# Patient Record
Sex: Female | Born: 1935 | Race: White | Hispanic: No | Marital: Married | Smoking: Former smoker
Health system: Southern US, Community
[De-identification: ages and names within clinical notes are randomized; demographics above are authoritative.]

## PROBLEM LIST (undated history)

## (undated) DIAGNOSIS — E7800 Pure hypercholesterolemia, unspecified: Secondary | ICD-10-CM

## (undated) DIAGNOSIS — E46 Unspecified protein-calorie malnutrition: Secondary | ICD-10-CM

## (undated) DIAGNOSIS — E785 Hyperlipidemia, unspecified: Secondary | ICD-10-CM

## (undated) DIAGNOSIS — K5909 Other constipation: Secondary | ICD-10-CM

## (undated) DIAGNOSIS — F0390 Unspecified dementia without behavioral disturbance: Secondary | ICD-10-CM

## (undated) DIAGNOSIS — I10 Essential (primary) hypertension: Secondary | ICD-10-CM

## (undated) HISTORY — PX: EXCISION MORTON'S NEUROMA: SHX5013

## (undated) HISTORY — DX: Unspecified dementia, unspecified severity, without behavioral disturbance, psychotic disturbance, mood disturbance, and anxiety: F03.90

## (undated) HISTORY — DX: Hyperlipidemia, unspecified: E78.5

## (undated) HISTORY — DX: Other constipation: K59.09

## (undated) HISTORY — PX: CHOLECYSTECTOMY: SHX55

## (undated) HISTORY — DX: Essential (primary) hypertension: I10

## (undated) HISTORY — DX: Unspecified protein-calorie malnutrition: E46

---

## 2001-07-27 ENCOUNTER — Ambulatory Visit (HOSPITAL_COMMUNITY): Admission: RE | Admit: 2001-07-27 | Discharge: 2001-07-27 | Payer: Self-pay | Admitting: *Deleted

## 2004-09-02 ENCOUNTER — Ambulatory Visit (HOSPITAL_COMMUNITY): Admission: RE | Admit: 2004-09-02 | Discharge: 2004-09-02 | Payer: Self-pay | Admitting: *Deleted

## 2012-04-24 ENCOUNTER — Emergency Department (HOSPITAL_COMMUNITY): Payer: Medicare Other

## 2012-04-24 ENCOUNTER — Inpatient Hospital Stay (HOSPITAL_COMMUNITY)
Admission: EM | Admit: 2012-04-24 | Discharge: 2012-04-29 | DRG: 603 | Disposition: A | Discharge status: Skilled Nursing Facility | Payer: Medicare Other | Attending: Internal Medicine | Admitting: Internal Medicine

## 2012-04-24 ENCOUNTER — Encounter (HOSPITAL_COMMUNITY): Payer: Self-pay | Admitting: Neurology

## 2012-04-24 DIAGNOSIS — L02219 Cutaneous abscess of trunk, unspecified: Principal | ICD-10-CM | POA: Diagnosis present

## 2012-04-24 DIAGNOSIS — R627 Adult failure to thrive: Secondary | ICD-10-CM | POA: Diagnosis present

## 2012-04-24 DIAGNOSIS — R109 Unspecified abdominal pain: Secondary | ICD-10-CM | POA: Diagnosis present

## 2012-04-24 DIAGNOSIS — L03319 Cellulitis of trunk, unspecified: Secondary | ICD-10-CM

## 2012-04-24 DIAGNOSIS — Z87891 Personal history of nicotine dependence: Secondary | ICD-10-CM

## 2012-04-24 DIAGNOSIS — IMO0002 Reserved for concepts with insufficient information to code with codable children: Secondary | ICD-10-CM

## 2012-04-24 DIAGNOSIS — E785 Hyperlipidemia, unspecified: Secondary | ICD-10-CM

## 2012-04-24 DIAGNOSIS — F0390 Unspecified dementia without behavioral disturbance: Secondary | ICD-10-CM | POA: Diagnosis present

## 2012-04-24 DIAGNOSIS — R5381 Other malaise: Secondary | ICD-10-CM | POA: Diagnosis present

## 2012-04-24 DIAGNOSIS — R4182 Altered mental status, unspecified: Secondary | ICD-10-CM

## 2012-04-24 DIAGNOSIS — E860 Dehydration: Secondary | ICD-10-CM | POA: Diagnosis present

## 2012-04-24 DIAGNOSIS — Z79899 Other long term (current) drug therapy: Secondary | ICD-10-CM

## 2012-04-24 DIAGNOSIS — D72829 Elevated white blood cell count, unspecified: Secondary | ICD-10-CM

## 2012-04-24 HISTORY — DX: Pure hypercholesterolemia, unspecified: E78.00

## 2012-04-24 LAB — CBC
HCT: 40.6 % (ref 36.0–46.0)
Hemoglobin: 13.7 g/dL (ref 12.0–15.0)
MCH: 30.6 pg (ref 26.0–34.0)
MCHC: 33.7 g/dL (ref 30.0–36.0)
MCV: 90.8 fL (ref 78.0–100.0)
Platelets: 279 10*3/uL (ref 150–400)
RBC: 4.47 MIL/uL (ref 3.87–5.11)
RDW: 13.6 % (ref 11.5–15.5)
WBC: 13 10*3/uL — ABNORMAL HIGH (ref 4.0–10.5)

## 2012-04-24 LAB — BASIC METABOLIC PANEL
BUN: 20 mg/dL (ref 6–23)
CO2: 29 mEq/L (ref 19–32)
Calcium: 8.8 mg/dL (ref 8.4–10.5)
Chloride: 96 mEq/L (ref 96–112)
Creatinine, Ser: 0.59 mg/dL (ref 0.50–1.10)
GFR calc Af Amer: >90 mL/min (ref >90)
GFR calc non Af Amer: 78 mL/min — ABNORMAL LOW (ref >90)
Glucose, Bld: 172 mg/dL — ABNORMAL HIGH (ref 70–99)
Potassium: 3.5 mEq/L (ref 3.5–5.1)
Sodium: 134 mEq/L — ABNORMAL LOW (ref 135–145)

## 2012-04-24 LAB — URINALYSIS, ROUTINE W REFLEX MICROSCOPIC
Bilirubin Urine: NEGATIVE
Glucose, UA: NEGATIVE mg/dL
Hgb urine dipstick: NEGATIVE
Ketones, ur: NEGATIVE mg/dL
Leukocytes, UA: NEGATIVE
Nitrite: NEGATIVE
Protein, ur: NEGATIVE mg/dL
Specific Gravity, Urine: 1.009 (ref 1.005–1.030)
Urobilinogen, UA: 0.2 mg/dL (ref 0.0–1.0)
pH: 6.5 (ref 5.0–8.0)

## 2012-04-24 LAB — CK: Total CK: 90 U/L (ref 7–177)

## 2012-04-24 MED ORDER — ADULT MULTIVITAMIN W/MINERALS CH
1.0000 | ORAL_TABLET | Freq: Every day | ORAL | Status: DC
  Administered 2012-04-24 – 2012-04-28 (×3): 1 via ORAL
  Filled 2012-04-24 (×4): qty 1

## 2012-04-24 MED ORDER — SIMVASTATIN 40 MG PO TABS
40.0000 mg | ORAL_TABLET | Freq: Every day | ORAL | Status: DC
  Administered 2012-04-24 – 2012-04-28 (×5): 40 mg via ORAL
  Filled 2012-04-24 (×8): qty 1

## 2012-04-24 MED ORDER — CALCIUM-VITAMIN D-VITAMIN K 500-500-40 MG-UNT-MCG PO CHEW: 1.0000 | CHEWABLE_TABLET | Freq: Every day | ORAL | Status: DC

## 2012-04-24 MED ORDER — CALCIUM CARBONATE-VITAMIN D 500-200 MG-UNIT PO TABS
1.0000 | ORAL_TABLET | Freq: Every day | ORAL | Status: DC
  Administered 2012-04-24 – 2012-04-29 (×5): 1 via ORAL
  Filled 2012-04-24 (×8): qty 1

## 2012-04-24 NOTE — ED Provider Notes (Signed)
Assumed care of patient from Dr Juleen China.  Patient presented today after a fall.  She apparently lives at home by herself and was on the floor for 3 days after the fall.  Her work up here today has been unremarkable.  Dr. Juleen China has consulted Case Management and has consulted the Hospitalist for admission.  He spoke with Dr. Ardyth Harps with [facility].  She is requesting that SW or Case Management be consulted prior to admitting the patient.    6:48 PM SW has been consulted.  Have left a voicemail.  Will try to contact again.  Discussed plan with the patient and her family.  Reassessed patient.  Patient alert and orientated to self, No acute distress.  She denies any pain at this time.  Heart RRR, Lungs CTAB, Abdomen soft and nontender.  7:00 PM Discussed with on call SW.  She reports that she will not be able to get the patient placed tonight.  Discussed with Dr. Denton Lank.  He recommends calling Dr. Ardyth Harps back to have her admit the patient under obs.  Patient is very unsteady on her feet and is unsafe to discharge home at this time.  7:15 PM Paged Dr. Ardyth Harps directly.  7:45 PM Spoke with Dr. Toniann Fail with [facility].  He recommends keeping the patient in the CDU overnight because she does not meet criteria for admission and having SW see patient in the morning for placement.  Discussed with [facility] Case Management and she states that the patient does meet criteria to stay in the CDU overnight and that Case Management and SW will work on Placement for the patient tomorrow.    11:42 PM Reassessed patient.  She is resting comfortably at this time.    11:54 PM Patient signed out to Dr. Weldon Inches at shift change.  Pascal Lux [place], PA-C 04/24/12 2355

## 2012-04-24 NOTE — ED Notes (Signed)
Left message for Jody our Child psychotherapist.

## 2012-04-24 NOTE — ED Notes (Signed)
Dinner tray ordered.

## 2012-04-24 NOTE — ED Notes (Signed)
Family at bedside. 

## 2012-04-24 NOTE — ED Notes (Signed)
Melonie, ON-Call Social Worker called  Back  And spoke with Doran Durand, PA

## 2012-04-24 NOTE — ED Provider Notes (Signed)
History    89yf brought in by family for essentially what seems like failure to thrive. Pt declining in terms of taking care of ADLs in past month. Currently lives independently with children checking in on her. Pt has increasingly seemed more tired and withdrawn. Just wants to lay in bed. Eating less. On Saturday son checked on pt because hadn't heard from since Wednesday. Found on floor at home covered in excrement. Seemed confused. Cleaned pt up and seemed more alert and back to her self so delayed coming in. Spoke with PCP and recommended bringing into ED. No recent med changes that they are aware of. Unsure of circumstances of how she fell or how long may have been down.   CSN: [id number]  Arrival date & time 04/24/12  1240   First MD Initiated Contact with Patient 04/24/12 1308      Chief Complaint  Patient presents with  . Altered Mental Status    (Consider location/radiation/quality/duration/timing/severity/associated sxs/prior treatment) HPI  Past Medical History  Diagnosis Date  . Hypercholesteremia     Past Surgical History  Procedure Date  . Cholecystectomy     No family history on file.  History  Substance Use Topics  . Smoking status: Former Games developer  . Smokeless tobacco: Not on file  . Alcohol Use: No    OB History    Grav Para Term Preterm Abortions TAB SAB Ect Mult Living                  Review of Systems   Review of symptoms negative unless otherwise noted in HPI.   Allergies  Demerol; Lodine; and Tylenol  Home Medications   Current Outpatient Rx  Name  Route  Sig  Dispense  Refill  . CALCIUM-VITAMIN D-VITAMIN K 500-500-40 MG-UNT-MCG PO CHEW   Oral   Chew 1 tablet by mouth daily.         . ADULT MULTIVITAMIN W/MINERALS CH   Oral   Take 1 tablet by mouth daily.         Marland Kitchen PRAVASTATIN SODIUM 40 MG PO TABS   Oral   Take 40 mg by mouth daily.           BP 118/63  Pulse 93  Temp 97.8 F (36.6 C) (Oral)  Resp 16  SpO2  96%  Physical Exam  Nursing note and vitals reviewed. Constitutional: She appears well-developed. No distress.       Frail and cachectic but not distressed.  HENT:  Head: Normocephalic and atraumatic.       No external signs of head trauma  Eyes: Conjunctivae normal are normal. Pupils are equal, round, and reactive to light. Right eye exhibits no discharge. Left eye exhibits no discharge.  Neck: Neck supple.  Cardiovascular: Normal rate, regular rhythm and normal heart sounds.  Exam reveals no gallop and no friction rub.   No murmur heard. Pulmonary/Chest: Effort normal and breath sounds normal. No respiratory distress.  Abdominal: Soft. She exhibits no distension. There is no tenderness.       Mild distension, but her daughter says how always looks. Non tender.  Musculoskeletal: She exhibits no edema and no tenderness.  Neurological: She is alert. No cranial nerve deficit. She exhibits normal muscle tone.       Speech clear. Follows basic commands. Oriented only to self.  Skin: Skin is warm and dry. She is not diaphoretic.  Psychiatric: Her behavior is normal.    ED Course  Procedures (including critical care  time)  Labs Reviewed  CBC - Abnormal; Notable for the following:    WBC 13.0 (*)     All other components within normal limits  BASIC METABOLIC PANEL - Abnormal; Notable for the following:    Sodium 134 (*)     Glucose, Bld 172 (*)     GFR calc non Af Amer 78 (*)     All other components within normal limits  URINALYSIS, ROUTINE W REFLEX MICROSCOPIC  CK   Ct Head Wo Contrast  04/24/2012  *RADIOLOGY REPORT*  Clinical Data: Altered mental status.  CT HEAD WITHOUT CONTRAST  Technique:  Contiguous axial images were obtained from the base of the skull through the vertex without contrast.  Comparison: None.  Findings: 1.9 x 1.5 cm rounded, densely calcified mass along the tentorium on the left, measured on image #9.  No associated mass effect or edema.  Diffusely enlarged  ventricles and subarachnoid spaces.  Patchy white matter low density in both cerebral hemispheres.  Small amount of bilateral basal ganglia calcification.  No intracranial hemorrhage or CT evidence of acute infarction.  Bilateral hyperostosis frontalis.  The included portions of the paranasal sinuses are normally pneumatized.  IMPRESSION:  1.  No acute abnormality. 2.  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 3.  Atrophy and chronic small vessel white matter ischemic changes.   Original Report Authenticated By: Beckie Salts, M.D.       1. Altered mental status       MDM  89yF with generalized fatigue and inability to take care of ADLs. Pt lives alone. Will discuss with case management for placement options.         Raeford Razor, MD 04/24/12 [phone]

## 2012-04-24 NOTE — ED Notes (Signed)
Family states pt has had mentation decline for past two months. Pt lives alone, daughter stays with pt for 7-10 days a month, son checks on her other times. Neighbors check on pt, pt refuses to had any help come in to house, including meals on wheels. Does not cook for self, has not been eating well.

## 2012-04-24 NOTE — ED Notes (Signed)
Dinner Tray at the bedside  

## 2012-04-24 NOTE — Progress Notes (Signed)
Called by EDP for potential admission for this patient. She was found down at home covered in urine and feces. Family does not believe she can continue to live on her own and wants to pursue placement. She does not meet medical criteria for an inpatient admission and as such, it would be impossible to place her directly from the hospital unless family were to pay out of pocket. Have asked EDP to consult the ED SW and CM for further recommendations.  Peggye Pitt, MD [facility] Pager: [phone]

## 2012-04-24 NOTE — ED Notes (Signed)
Patient is resting comfortably. 

## 2012-04-24 NOTE — ED Notes (Signed)
Helped patient up to the bedside commode.

## 2012-04-24 NOTE — ED Notes (Signed)
Family assisting pt. With dinner

## 2012-04-24 NOTE — ED Notes (Signed)
Pt comes from home where she lives alone. Family had to break into home on Saturday found her on the floor prior to this last talked to her on Wednesday. EMS called out today because patient is confused and not acting right per her normal.  Pt is alert, disoriented x 4. When asked if she hurts "I have no idea". Pt following commands. BP 131/57, HR 90, RR 16, 97% RA, CBG 188.

## 2012-04-24 NOTE — ED Notes (Signed)
Pt provided graham crackers and milk. Pt passed swallow screen.

## 2012-04-25 ENCOUNTER — Emergency Department (HOSPITAL_COMMUNITY): Payer: Medicare Other

## 2012-04-25 ENCOUNTER — Encounter (HOSPITAL_COMMUNITY): Payer: Self-pay

## 2012-04-25 MED ORDER — SODIUM CHLORIDE 0.9 % IV SOLN
Freq: Once | INTRAVENOUS | Status: AC
  Administered 2012-04-25: 15:00:00 via INTRAVENOUS

## 2012-04-25 NOTE — ED Notes (Signed)
Upon entering room for hourly round assessment. Left wrist IV access removed by patient. Catheter intact on inspection. Bleeding controled. No skin discoloration noted. 0.9% NS paused. Onalee Hua, PA made aware.

## 2012-04-25 NOTE — Clinical Social Work Psychosocial (Signed)
Clinical Social Work Department BRIEF PSYCHOSOCIAL ASSESSMENT 04/25/2012  Patient:  Denise Lynn, Denise Lynn     Account Number:  [id number]     Admit date:  04/24/2012  Clinical Social Worker:  Delmer Islam  Date/Time:  04/25/2012 02:36 AM  Referred by:  Care Management  Date Referred:  04/25/2012 Referred for  SNF Placement   Other Referral:   Interview type:  Family Other interview type:    PSYCHOSOCIAL DATA Living Status:  ALONE Admitted from facility:   Level of care:   Primary support name:  Denise Lynn Primary support relationship to patient:  CHILD, ADULT Degree of support available:   Mr. Wafer [phone]) lives in [place] and checks on patient regulary and purchases her groceries. Patient's daughter Denise Lynn lives in [place], [place] [phone]) but visits patient regularly.    CURRENT CONCERNS Current Concerns  Post-Acute Placement   Other Concerns:    SOCIAL WORK ASSESSMENT / PLAN CSW talked at the bedside with patient's son and daughter. Patient was found after 3 days on the floor of her bedroom. Patient lives alone and has been fairly functional in meeting her basic needs until recently. Per son and daughter, up until 2 months ago when became wobbly when ambulating, patient was walking without the assistant of a walker. The patient stopped preparing her meals about 6 months ago and now eats simple meals as cereal, bananas, etc. The son and daughter also report that the patient now just wants to sleep all thtoe time. Son and daughter very concerned about patient's decline and are requesting SNF placement with the possibliltihy that she may have to remain at the SNF, or possibly transition to an ALF based on her improvement. Per family, patinet will not be able to return home.    CSW talked to family about the SNF search process and was given their facility choices in order of preference: [facility], [facility], [facility], [facility]. CSW and family  also talked about the future possibility of applying for Medicaid or consulting an Radio producer.   Assessment/plan status:  Psychosocial Support/Ongoing Assessment of Needs Other assessment/ plan:   Information/referral to community resources:   SNF list for [facility]    PATIENT'S/FAMILY'S RESPONSE TO PLAN OF CARE: Daughter and son very concerned about patient and the decline they have noticed. They have been aware that other housing options might soon be needed and the daughter had already begun  investigating nursing homes. Family very appreciative of CSW assistance in placing patient and providing information regarding Medicaid.

## 2012-04-25 NOTE — ED Notes (Signed)
Family at bedside. 

## 2012-04-25 NOTE — ED Provider Notes (Signed)
Patient holding in CDU awaiting nursing home placement.  Patient resting comfortably on exam, does not voice any complaints.  She is awake, conversant, but pleasantly confused.  Hemodynamically stable, lungs clear to auscultation bilaterally.  S1/S2, RRR,  Abdomen soft, bowel sounds present.  Jimmye Norman, NP 04/25/12 [phone]

## 2012-04-25 NOTE — ED Notes (Signed)
Patient was attempting to pull IV out and I re oriented the patient.  She also had taken her gown off and I put the gown back on her and advised her she was in CDU waiting to be approved for placement in another facility.

## 2012-04-25 NOTE — ED Provider Notes (Signed)
Patient placed in the CDU overnight Dr. Juleen China.  Patient was home and fell last week lying on the floor for 3 days. Family states that she has just not been getting better they brought her in for help with placement. Dr. Juleen China consulted case management and social work for assistance with placement.    9:06 AM Patient resting comfortably, alert and oriented to self.   On exam: hemodynamically stable, NAD, heart w/ RRR, lungs CTAB, Chest & abd non-tender, no peripheral edema or calf tenderness.     1:45 PM Dr Fonnie Jarvis assessed the patient and suggested a CXR which was ordered and found to be negative for active disease.  Pt did not eat any of her lunch and has spent most of the day asleep.  When aroused she is alert to person and place.  IVF maintenance fluids started as pt is not taking anything by mouth at this time.  Social work and Case Management have both been by today.  Pt is a private pay for nursing home placement. Family is okay with this and we will proceed with finding her place to go.  4:06 PM Report given to Felicie Morn, PA-C and he will assume care.  Dahlia Client Latona Krichbaum, PA-C 04/25/12 1607

## 2012-04-25 NOTE — Clinical Social Work Placement (Addendum)
Clinical Social Work Department CLINICAL SOCIAL WORK PLACEMENT NOTE 04/25/2012  Patient:  ITZIA, CUNLIFFE  Account Number:  [id number] Admit date:  04/24/2012  Clinical Social Worker:  Genelle Bal, LCSW  Date/time:  04/25/2012 06:19 AM  Clinical Social Work is seeking post-discharge placement for this patient at the following level of care:   SKILLED NURSING   (*CSW will update this form in Epic as items are completed)   04/25/2012  Patient/family provided with [facility] Department of Clinical Social Work's list of facilities offering this level of care within the geographic area requested by the patient (or if unable, by the patient's family).  04/25/2012  Patient/family informed of their freedom to choose among providers that offer the needed level of care, that participate in Medicare, Medicaid or managed care program needed by the patient, have an available bed and are willing to accept the patient.    Patient/family informed of [facility]' ownership interest in [facility], as well as of the fact that they are under no obligation to receive care at this facility.  PASARR submitted to EDS on 04/25/2012 PASARR number received from EDS on 04/25/2012  FL2 transmitted to all facilities in geographic area requested by pt/family on  04/25/2012 FL2 transmitted to all facilities within larger geographic area on   Patient informed that his/her managed care company has contracts with or will negotiate with  certain facilities, including the following:     Patient/family informed of bed offers received:   Patient chooses bed at  Physician recommends and patient chooses bed at    Patient to be transferred to [facility] 04/29/2012 Patient to be transferred to facility by [facility]  The following physician request were entered in Epic:   Additional Comments:

## 2012-04-25 NOTE — Progress Notes (Signed)
   CARE MANAGEMENT ED NOTE 04/25/2012  Patient:  KENTLEY, CEDILLO   Account Number:  [id number]  Date Initiated:  04/25/2012  Documentation initiated by:  Fransico Michael  Subjective/Objective Assessment:   presented to ED with c/o altered mental status     Subjective/Objective Assessment Detail:     Action/Plan:   Action/Plan Detail:   Anticipated DC Date:  04/25/2012     Status Recommendation to Physician:   Result of Recommendation:      DC Planning Services  CM consult    Choice offered to / List presented to:            Status of service:  Completed, signed off  ED Comments:   ED Comments Detail:  04/25/12-1103-J.Najeeb Uptain,RN,BSN [phone]     Received call last night from CDU PA regarding patient and need for placement. PA informed CM that patient did not meet requirements to be admitted. CM informed PA that CSW would have to be involved as well and that we would see patient this morning.      In to see patient, along with Erie Noe, CSW. Patient noted to be sleeping in bed. Adult children at bedside. Spoke with children who reported that patient "lives alone and up until about 2 months ago had been doing great and was very independent." When questioned about ideas for care of patient, both children requested skilled nursing home placement and voiced willingness to pay out of pocket. No further CM needs identified at this time. CSW to continue to work with family.

## 2012-04-25 NOTE — ED Provider Notes (Signed)
Pt seen in obs, holding unit by [facility], awaiting social work complete evaluation and placement.    Denise Pound. Faustino Luecke, MD 04/25/12 1649

## 2012-04-25 NOTE — ED Notes (Signed)
Patient is resting comfortably. 

## 2012-04-26 ENCOUNTER — Inpatient Hospital Stay (HOSPITAL_COMMUNITY): Payer: Medicare Other

## 2012-04-26 ENCOUNTER — Encounter (HOSPITAL_COMMUNITY): Payer: Self-pay

## 2012-04-26 DIAGNOSIS — E785 Hyperlipidemia, unspecified: Secondary | ICD-10-CM

## 2012-04-26 DIAGNOSIS — D72829 Elevated white blood cell count, unspecified: Secondary | ICD-10-CM

## 2012-04-26 DIAGNOSIS — IMO0002 Reserved for concepts with insufficient information to code with codable children: Secondary | ICD-10-CM

## 2012-04-26 DIAGNOSIS — L02219 Cutaneous abscess of trunk, unspecified: Principal | ICD-10-CM

## 2012-04-26 DIAGNOSIS — R4182 Altered mental status, unspecified: Secondary | ICD-10-CM

## 2012-04-26 LAB — URINALYSIS, ROUTINE W REFLEX MICROSCOPIC
Glucose, UA: NEGATIVE mg/dL
pH: 5.5 (ref 5.0–8.0)

## 2012-04-26 LAB — BASIC METABOLIC PANEL
CO2: 34 mEq/L — ABNORMAL HIGH (ref 19–32)
Chloride: 96 mEq/L (ref 96–112)
Creatinine, Ser: 0.74 mg/dL (ref 0.50–1.10)
Glucose, Bld: 204 mg/dL — ABNORMAL HIGH (ref 70–99)
Sodium: 137 mEq/L (ref 135–145)

## 2012-04-26 LAB — URINE MICROSCOPIC-ADD ON

## 2012-04-26 LAB — CBC WITH DIFFERENTIAL/PLATELET
Basophils Absolute: 0 10*3/uL (ref 0.0–0.1)
Eosinophils Relative: 4 % (ref 0–5)
HCT: 38.1 % (ref 36.0–46.0)
Lymphocytes Relative: 6 % — ABNORMAL LOW (ref 12–46)
Lymphs Abs: 1 10*3/uL (ref 0.7–4.0)
MCV: 94.1 fL (ref 78.0–100.0)
Monocytes Absolute: 0.8 10*3/uL (ref 0.1–1.0)
Neutro Abs: 13 10*3/uL — ABNORMAL HIGH (ref 1.7–7.7)
RBC: 4.05 MIL/uL (ref 3.87–5.11)
RDW: 13.7 % (ref 11.5–15.5)
WBC: 15.2 10*3/uL — ABNORMAL HIGH (ref 4.0–10.5)

## 2012-04-26 LAB — TROPONIN I: Troponin I: <0.3 ng/mL (ref <0.30)

## 2012-04-26 MED ORDER — SODIUM CHLORIDE 0.9 % IJ SOLN
3.0000 mL | Freq: Two times a day (BID) | INTRAMUSCULAR | Status: DC
  Administered 2012-04-27 – 2012-04-28 (×2): 3 mL via INTRAVENOUS

## 2012-04-26 MED ORDER — ACETAMINOPHEN 325 MG PO TABS: 650.0000 mg | ORAL_TABLET | Freq: Four times a day (QID) | ORAL | Status: DC | PRN

## 2012-04-26 MED ORDER — ENSURE COMPLETE PO LIQD
237.0000 mL | Freq: Two times a day (BID) | ORAL | Status: DC
  Administered 2012-04-26 – 2012-04-29 (×5): 237 mL via ORAL

## 2012-04-26 MED ORDER — CEFAZOLIN SODIUM 1-5 GM-% IV SOLN
1.0000 g | Freq: Three times a day (TID) | INTRAVENOUS | Status: DC
  Administered 2012-04-26 – 2012-04-28 (×6): 1 g via INTRAVENOUS
  Filled 2012-04-26 (×9): qty 50

## 2012-04-26 MED ORDER — ONDANSETRON HCL 4 MG PO TABS: 4.0000 mg | ORAL_TABLET | Freq: Four times a day (QID) | ORAL | Status: DC | PRN

## 2012-04-26 MED ORDER — ACETAMINOPHEN 650 MG RE SUPP: 650.0000 mg | Freq: Four times a day (QID) | RECTAL | Status: DC | PRN

## 2012-04-26 MED ORDER — SODIUM CHLORIDE 0.9 % IV SOLN
INTRAVENOUS | Status: DC
  Administered 2012-04-26: 17:00:00 via INTRAVENOUS

## 2012-04-26 MED ORDER — ONDANSETRON HCL 4 MG/2ML IJ SOLN: 4.0000 mg | Freq: Four times a day (QID) | INTRAMUSCULAR | Status: DC | PRN

## 2012-04-26 MED ORDER — ENOXAPARIN SODIUM 40 MG/0.4ML ~~LOC~~ SOLN
40.0000 mg | SUBCUTANEOUS | Status: DC
  Administered 2012-04-26 – 2012-04-28 (×3): 40 mg via SUBCUTANEOUS
  Filled 2012-04-26 (×4): qty 0.4

## 2012-04-26 MED ORDER — VALACYCLOVIR HCL 500 MG PO TABS
1000.0000 mg | ORAL_TABLET | Freq: Two times a day (BID) | ORAL | Status: DC
  Administered 2012-04-26 – 2012-04-29 (×6): 1000 mg via ORAL
  Filled 2012-04-26 (×10): qty 2

## 2012-04-26 NOTE — ED Provider Notes (Signed)
Pt placed in CDU on holding for nursing home placement.  Pt alert, pleasantly confused, NAD, denies pain.   On exam: hemodynamically stable, NAD, heart w/ RRR, lungs CTAB, Chest & abd non-tender, no peripheral edema or calf tenderness.  BP 96/56  Pulse 91  Temp 98.9 F (37.2 C) (Oral)  Resp 10  SpO2 93%    10:58 AM  Patient evaluated by [facility] with the decision to admit.    12:40 PM  Reassessed and resting comfortably.  Up to chair.  Alert and interactive, NAD, denies pain or other complaints.  BP 120/52  Pulse 89  Temp 97.9 F (36.6 C) (Oral)  Resp 16  SpO2 94%   Pt admitted by [facility].  Dahlia Client Newman Waren, PA-C 04/26/12 1644

## 2012-04-26 NOTE — ED Provider Notes (Signed)
Medical screening examination/treatment/procedure(s) were performed by non-physician practitioner and as supervising physician I was immediately available for consultation/collaboration.  Geoffery Lyons, MD 04/26/12 [phone]

## 2012-04-26 NOTE — ED Notes (Signed)
Use steady to get pt.up in recliner so pt. Can eat lunch

## 2012-04-26 NOTE — Progress Notes (Signed)
Received pt from ED.  Pt alert to person.  Family at the bedside. Placed patient on high fall risk.  Pt placed on telemetry. Stage II to left buttocks. Reddened rash to right back.  Skin tear to right lateral neck with gauze drsg.  Pt is not in distress. Cleaned up patient and replaced with new gown and sheets. Will continue to monitor.

## 2012-04-26 NOTE — Clinical Social Work Note (Signed)
CSW contacted by patient's daughter and advised that patient will be admitted to hospital per Dr. Arbutus Leas. CSW advised daughter that she will be advised of SNF responses.  Patient admitted and is on 5500.  Genelle Bal, MSW, LCSW [phone]

## 2012-04-26 NOTE — ED Provider Notes (Signed)
Pt of Dr Juleen China, in cdu awaiting cm/sw and hospitalist eval.   Suzi Roots, MD 04/26/12 [phone]

## 2012-04-26 NOTE — Progress Notes (Signed)
KATHYRN WARMUTH [id number] Admission Data: 04/26/2012 5:07 PM Attending Provider: Catarina Hartshorn, MD  PCP:No primary provider on file. Consults/ Treatment Team:    ILYNN STAUFFER is a 78 y.o. female patient admitted from ED awake, alert  & orientated  X 3,  Full Code, VSS - Blood pressure 117/43, pulse 91, temperature 98.9 F (37.2 C), temperature source Oral, resp. rate 18, SpO2 96.00%., no c/o shortness of breath, no c/o chest pain, no distress noted. Tele # [id number] placed and pt is currently running:normal sinus rhythm.   IV site WDL:  hand right, condition patent and no redness with a transparent dsg that's clean dry and intact.  Allergies:   Allergies  Allergen Reactions  . Demerol (Meperidine) Other (See Comments)    agitation  . Lodine (Etodolac) Other (See Comments)    Mouth blisters  . Tylenol (Acetaminophen) Other (See Comments)    dizziness     Past Medical History  Diagnosis Date  . Hypercholesteremia   . Dementia     History:  obtained from child. Tobacco/alcohol: denied none  Pt orientation to unit, room and routine. Information packet given to patient/family and safety video watched.  Admission INP armband ID verified with patient/family, and in place. SR up x 2, fall risk assessment complete with Patient and family verbalizing understanding of risks associated with falls. Pt verbalizes an understanding of how to use the call bell and to call for help before getting out of bed.  Skin has scars on midsection that are sensitive. Scabs on back no draining. Stage II on L Buttock. Scab on L foot.    Will cont to monitor and assist as needed.  Tarahji Ramthun Consuella Lose, RN 04/26/2012 5:07 PM

## 2012-04-26 NOTE — Progress Notes (Signed)
Spoke with Melissa with infection control and agreed that patient did not have active/draining or fluid filled pustules. All scabs on back. Discontinued contact precautions.

## 2012-04-26 NOTE — H&P (Signed)
[facility] History and Physical  Denise Lynn NUU:[id number] DOB: [birth year] DOA: 04/24/2012   PCP: No primary provider on file.   Chief Complaint: Altered mental status  HPI:   78 year old female with a history of hyperlipidemia presented with altered mental status. The patient is presently confused; therefore, this history is from the patient's daughter who is at the bedside. The patient lives by herself. The patient's son, [name], checks up on her every 7-10 days. However, the patient has had a significant decline in the past 2 months in terms of her physical stability and appetite. The patient's son found the patient on Saturday, 04/22/2012, in her own feces. The patient was arousable, but was confused when she was aroused. The patient's daughter, [name], lives near [place] and had not been able to contact the patient by telephone for a period of 3 days. At that time, [name] contacted [name], the son, to go over to the patient's home where he found her in her own feces. The patient was subsequently brought to the emergency department for further evaluation. Apparently, the patient has not been able to fix her own food for at least 2 months. The patient's son and daughter would cook for the patient intermittently and  stored food in the refrigerator with instructions for the patient to eat or keep up the food. However, the patient has not followed these instructions. Instead, the patient has been eating mostly nonperishable type items for the past 2-3 months. In addition, there was also some history that the patient has had increasing difficulty getting around. The patient went to see her primary care provider, Dr. [name], 2 weeks ago.according to the daughter, the patient was much more alert at that time. However, the daughter does note that the patient has had a gradual decline over the last few months. The son and the daughter have to help the patient with her checkbook.  Assessment/Plan:  altered mental status  -Likely multifactorial including the possibility of herpes zoster/cellulitis on her back as well as a component of dehydration, volume depletion  -Judicious IV fluids -At the time of my evaluation, no labs has been done for recheck on the patient in 48 hours--order BMP and CBC now -CT brain negative Cellulitis/herpes zoster dermatitis  -It was somewhat difficult to differentiate whether this was fully herpes zoster with secondary bacterial infection due to the patient being immobile in her own feces for 3 days -Nevertheless, there was erythema and small vesicles primarily right-sided on the patient's right thoracic back  -Although the patient is beyond the 72 hour window, treatment with Valtrex will decrease chances of postherpetic neuralgia and complications from HZV -Will also start the patient on cefazolin for possible secondary bacterial infection - patient has not been on any antibiotics nor hospitalized recently  Leukocytosis  -May be due to herpes zoster/cellulitis  -Blood cultures x2 sets  -Valtrex and cefazolin as discussed  -Urinalysis does not suggest UTI  Deconditioning  -PT evaluation  -Family requests SNF placement Failure to thrive  -Check prealbumin  -Ensure supplementation  Abdominal pain -May be constipation -Check two-view abdomen      Past Medical History  Diagnosis Date  . Hypercholesteremia    Past Surgical History  Procedure Date  . Cholecystectomy    Social History:  reports that she has quit smoking. She does not have any smokeless tobacco history on file. She reports that she does not drink alcohol or use illicit drugs.    family history : Unobtainable secondary to  patient's mental status   Allergies  Allergen Reactions  . Demerol (Meperidine) Other (See Comments)    agitation  . Lodine (Etodolac) Other (See Comments)    Mouth blisters  . Tylenol (Acetaminophen) Other (See Comments)    dizziness      Prior to  Admission medications   Medication Sig Start Date End Date Taking? Authorizing Provider  Calcium-Vitamin D-Vitamin K (VIACTIV) 500-500-40 MG-UNT-MCG CHEW Chew 1 tablet by mouth daily.   Yes Historical Provider, MD  Multiple Vitamin (MULTIVITAMIN WITH MINERALS) TABS Take 1 tablet by mouth daily.   Yes Historical Provider, MD  pravastatin (PRAVACHOL) 40 MG tablet Take 40 mg by mouth daily.   Yes Historical Provider, MD    Review of Systems:  Limited due to the patient's mental status . However, patient currently denies any headache, visual changes, chest pain, shortness breath, nausea, vomiting, diarrhea, abdominal pain, coughing, hemoptysis, abdominal pain.   Physical Exam: Filed Vitals:   04/25/12 1807 04/26/12 0005 04/26/12 0615 04/26/12 0953  BP:  114/61 96/56 120/52  Pulse:  90 91 89  Temp: 98.7 F (37.1 C) 99 F (37.2 C) 98.9 F (37.2 C) 97.9 F (36.6 C)  TempSrc: Oral Axillary Oral Oral  Resp:  20 10 16   SpO2:  94% 93% 94%   General:  A&O x 3, NAD, nontoxic, pleasant/cooperative Head/Eye: No conjunctival hemorrhage, no icterus, [place]/AT, No nystagmus ENT:  No icterus,  No thrush, good dentition, no pharyngeal exudate Neck:  No masses, no lymphadenpathy, no bruits CV:  RRR, no rub, no gallop, no S3 Lung:  CTAB, good air movement, no wheeze, no rhonchi Abdomen: soft/ +BS, nondistended, no peritoneal signs--mild tenderness to palpation right upper quadrant, right lower quadrant, no rebound tenderness Ext: No cyanosis,  No petechiae, No lymphangitis, No edema--right flank/posterior thorax with erythema and scattered vesicles--no open wounds, no necrosis   Labs on Admission:  Basic Metabolic Panel:  Lab 04/24/12 1610  NA 134*  K 3.5  CL 96  CO2 29  GLUCOSE 172*  BUN 20  CREATININE 0.59  CALCIUM 8.8  MG --  PHOS --   Liver Function Tests: No results found for this basename: AST:5,ALT:5,ALKPHOS:5,BILITOT:5,PROT:5,ALBUMIN:5 in the last 168 hours No results found for this  basename: LIPASE:5,AMYLASE:5 in the last 168 hours No results found for this basename: AMMONIA:5 in the last 168 hours CBC:  Lab 04/24/12 1313  WBC 13.0*  NEUTROABS --  HGB 13.7  HCT 40.6  MCV 90.8  PLT 279   Cardiac Enzymes:  Lab 04/24/12 1359  CKTOTAL 90  CKMB --  CKMBINDEX --  TROPONINI --   BNP: No components found with this basename: POCBNP:5 CBG: No results found for this basename: GLUCAP:5 in the last 168 hours  Radiological Exams on Admission: Ct Head Wo Contrast  04/24/2012  *RADIOLOGY REPORT*  Clinical Data: Altered mental status.  CT HEAD WITHOUT CONTRAST  Technique:  Contiguous axial images were obtained from the base of the skull through the vertex without contrast.  Comparison: None.  Findings: 1.9 x 1.5 cm rounded, densely calcified mass along the tentorium on the left, measured on image #9.  No associated mass effect or edema.  Diffusely enlarged ventricles and subarachnoid spaces.  Patchy white matter low density in both cerebral hemispheres.  Small amount of bilateral basal ganglia calcification.  No intracranial hemorrhage or CT evidence of acute infarction.  Bilateral hyperostosis frontalis.  The included portions of the paranasal sinuses are normally pneumatized.  IMPRESSION:  1.  No  acute abnormality. 2.  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 3.  Atrophy and chronic small vessel white matter ischemic changes.   Original Report Authenticated By: Beckie Salts, M.D.    Dg Chest Portable 1 View  04/25/2012  *RADIOLOGY REPORT*  Clinical Data: Altered mental status  PORTABLE CHEST - 1 VIEW  Comparison: None.  Findings: Cardiomediastinal silhouette is unremarkable.  Mild elevation of the right hemidiaphragm.  Mild right basilar atelectasis.  No acute infiltrate or pulmonary edema.  Mild degenerative changes thoracic spine.  IMPRESSION: No active disease.  Mild elevation of the right hemidiaphragm with right basilar atelectasis.   Original Report Authenticated By:  Natasha Mead, M.D.     EKG: Independently reviewed. Sinus rhythm, no ST-T wave changes    Time spent:70 minutes Code Status:   Full Family Communication:   daughter at bedside   Abem Shaddix, DO  [facility] Pager [phone]  If 7PM-7AM, please contact night-coverage [email or web address] Password [facility] 04/26/2012, 10:41 AM

## 2012-04-26 NOTE — Care Management Note (Unsigned)
    Page 1 of 1   04/26/2012     3:28:48 PM   CARE MANAGEMENT NOTE 04/26/2012  Patient:  Denise Lynn, Denise Lynn   Account Number:  [id number]  Date Initiated:  04/26/2012  Documentation initiated by:  Letha Cape  Subjective/Objective Assessment:   dx cellulitis of flank  admit- lives alone.     Action/Plan:   pt eval   Anticipated DC Date:  04/28/2012   Anticipated DC Plan:  SKILLED NURSING FACILITY  In-house referral  Clinical Social Worker      DC Planning Services  CM consult      Choice offered to / List presented to:             Status of service:  In process, will continue to follow Medicare Important Message given?   (If response is "NO", the following Medicare IM given date fields will be blank) Date Medicare IM given:   Date Additional Medicare IM given:    Discharge Disposition:    Per UR Regulation:  Reviewed for med. necessity/level of care/duration of stay  If discussed at Long Length of Stay Meetings, dates discussed:    Comments:  04/26/12 15:27 Letha Cape RN, BSN [phone] patient lives alone, son cks on her every 7-10 per note, awaiting pt eval.

## 2012-04-26 NOTE — ED Notes (Signed)
PAITIENT IS NONAMBULATORY. SHE IS IN HOLDING PATTERN AWAITING NURSING HOME PLACEMENT. SHE IS INCONTINENT OF BOWEL AND BLADDER. SHE IS A TOTAL ASSIST AT MEAL TIMES.

## 2012-04-26 NOTE — ED Notes (Signed)
Family at bedside. 

## 2012-04-27 LAB — COMPREHENSIVE METABOLIC PANEL
AST: 18 U/L (ref 0–37)
CO2: 30 mEq/L (ref 19–32)
Calcium: 8.2 mg/dL — ABNORMAL LOW (ref 8.4–10.5)
Creatinine, Ser: 0.57 mg/dL (ref 0.50–1.10)
GFR calc Af Amer: >90 mL/min (ref >90)
GFR calc non Af Amer: 79 mL/min — ABNORMAL LOW (ref >90)
Glucose, Bld: 187 mg/dL — ABNORMAL HIGH (ref 70–99)
Total Protein: 5.7 g/dL — ABNORMAL LOW (ref 6.0–8.3)

## 2012-04-27 LAB — PREALBUMIN: Prealbumin: 5.5 mg/dL — ABNORMAL LOW (ref 17.0–34.0)

## 2012-04-27 LAB — HEMOGLOBIN A1C: Mean Plasma Glucose: 137 mg/dL — ABNORMAL HIGH (ref <117)

## 2012-04-27 MED ORDER — POTASSIUM CHLORIDE CRYS ER 20 MEQ PO TBCR
40.0000 meq | EXTENDED_RELEASE_TABLET | Freq: Once | ORAL | Status: AC
  Administered 2012-04-27: 40 meq via ORAL
  Filled 2012-04-27: qty 2

## 2012-04-27 NOTE — Progress Notes (Signed)
[facility] PROGRESS NOTE  Denise Lynn ZOX:[id number] DOB: 1919-04-06 DOA: 04/24/2012 PCP: No primary provider on file.  HPI/Subjective: Denies any specific complaint   Assessment/Plan:  Altered mental status  -Patient likely has delirium on top of dementia. -Patient is confused and disoriented but she is fully awake. -She has negative CT scan of the head, MB secondary to dehydration or acute illness.  Cellulitis/herpes zoster dermatitis  -It was somewhat difficult to differentiate whether this was fully herpes zoster with secondary bacterial infection due to the patient being immobile in her own feces for 3 days  -Nevertheless, there was erythema and small vesicles primarily right-sided on the patient's right thoracic back  -Although the patient is beyond the 72 hour window, treatment with Valtrex will decrease chances of postherpetic neuralgia and complications from HZV  -Cefazolin started for any possible secondary bacterial infection.  Leukocytosis  -May be due to herpes zoster/cellulitis  -Blood cultures x2 sets  -Valtrex and cefazolin as discussed  -Urinalysis does not suggest UTI.  Deconditioning  -PT/OT evaluation  -Family requests SNF placement.   Failure to thrive  -Check prealbumin  -Ensure supplementation   Abdominal pain  -May be constipation  -Abdominal x-ray showed no free air or obstruction.   Code Status: Full Family Communication:  Disposition Plan: Remains inpatient   Consultants:  None  Procedures:  None  Antibiotics:  Cefazolin started on 04/26/2012   Objective: Filed Vitals:   04/26/12 1500 04/26/12 1700 04/26/12 2053 04/27/12 0445  BP: 117/43  112/89 124/43  Pulse: 91  100 92  Temp: 98.9 F (37.2 C)  97.9 F (36.6 C) 98.1 F (36.7 C)  TempSrc: Oral  Oral Oral  Resp: 18  16 16   Height:  5\' 5"  (1.651 m)    Weight:  57.607 kg (127 lb)    SpO2: 96%  94% 96%    Intake/Output Summary (Last 24 hours) at 04/27/12  1331 Last data filed at 04/27/12 0900  Gross per 24 hour  Intake 1461.75 ml  Output      0 ml  Net 1461.75 ml   Filed Weights   04/26/12 1700  Weight: 57.607 kg (127 lb)    Exam:  General: Alert and awake, oriented x3, not in any acute distress. HEENT: anicteric sclera, pupils reactive to light and accommodation, EOMI CVS: S1-S2 clear, no murmur rubs or gallops Chest: clear to auscultation bilaterally, no wheezing, rales or rhonchi Abdomen: soft nontender, nondistended, normal bowel sounds, no organomegaly Extremities: no cyanosis, clubbing or edema noted bilaterally Neuro: Cranial nerves II-XII intact, no focal neurological deficits  Data Reviewed: Basic Metabolic Panel:  Lab 04/27/12 8119 04/26/12 0946 04/24/12 1313  NA 140 137 134*  K 3.4* 3.7 3.5  CL 102 96 96  CO2 30 34* 29  GLUCOSE 187* 204* 172*  BUN 14 16 20   CREATININE 0.57 0.74 0.59  CALCIUM 8.2* 8.7 8.8  MG -- -- --  PHOS -- -- --   Liver Function Tests:  Lab 04/27/12 0700  AST 18  ALT 13  ALKPHOS 58  BILITOT 0.2*  PROT 5.7*  ALBUMIN 1.9*   No results found for this basename: LIPASE:5,AMYLASE:5 in the last 168 hours No results found for this basename: AMMONIA:5 in the last 168 hours CBC:  Lab 04/26/12 0946 04/24/12 1313  WBC 15.2* 13.0*  NEUTROABS 13.0* --  HGB 12.5 13.7  HCT 38.1 40.6  MCV 94.1 90.8  PLT 253 279   Cardiac Enzymes:  Lab 04/26/12 1701 04/26/12 1100  04/24/12 1359  CKTOTAL -- -- 90  CKMB -- -- --  CKMBINDEX -- -- --  TROPONINI <0.30 <0.30 --   BNP (last 3 results) No results found for this basename: PROBNP:3 in the last 8760 hours CBG: No results found for this basename: GLUCAP:5 in the last 168 hours  Recent Results (from the past 240 hour(s))  CULTURE, BLOOD (ROUTINE X 2)     Status: Normal (Preliminary result)   Collection Time   04/26/12 11:00 AM      Component Value Range Status Comment   Specimen Description BLOOD RIGHT ARM   Final    Special Requests BOTTLES  DRAWN AEROBIC AND ANAEROBIC 10CC   Final    Culture  Setup Time 04/26/2012 16:12   Final    Culture     Final    Value:        BLOOD CULTURE RECEIVED NO GROWTH TO DATE CULTURE WILL BE HELD FOR 5 DAYS BEFORE ISSUING A FINAL NEGATIVE REPORT   Report Status PENDING   Incomplete   CULTURE, BLOOD (ROUTINE X 2)     Status: Normal (Preliminary result)   Collection Time   04/26/12 11:07 AM      Component Value Range Status Comment   Specimen Description BLOOD LEFT ARM   Final    Special Requests BOTTLES DRAWN AEROBIC AND ANAEROBIC 10CC   Final    Culture  Setup Time 04/26/2012 16:12   Final    Culture     Final    Value:        BLOOD CULTURE RECEIVED NO GROWTH TO DATE CULTURE WILL BE HELD FOR 5 DAYS BEFORE ISSUING A FINAL NEGATIVE REPORT   Report Status PENDING   Incomplete      Studies: Dg Abd Portable 2v  04/26/2012  *RADIOLOGY REPORT*  Clinical Data: Abdominal pain  PORTABLE ABDOMEN - 2 VIEW  Comparison: None.  Findings: Scattered air and stool throughout the bowel.  No definite free air on the decubitus view.  Lung bases clear. Atherosclerotic calcifications noted.  Diffuse degenerative changes of the spine and osteopenia.  IMPRESSION: Negative for obstruction or free air.   Original Report Authenticated By: Judie Petit. Shick, M.D.     Scheduled Meds:   . calcium-vitamin D  1 tablet Oral Daily  .  ceFAZolin (ANCEF) IV  1 g Intravenous Q8H  . enoxaparin (LOVENOX) injection  40 mg Subcutaneous Q24H  . feeding supplement  237 mL Oral BID BM  . multivitamin with minerals  1 tablet Oral Daily  . simvastatin  40 mg Oral q1800  . sodium chloride  3 mL Intravenous Q12H  . valACYclovir  1,000 mg Oral BID   Continuous Infusions:   . sodium chloride 75 mL/hr at 04/26/12 1705    Active Problems:  Cellulitis of flank  Altered mental status  Hyperlipidemia  Leukocytosis  Failure to thrive    Time spent: 36 minutes    [facility]  [facility] Pager [phone]. If 8PM-8AM, please  contact night-coverage at [email or web address], password [facility] 04/27/2012, 1:31 PM  LOS: 3 days

## 2012-04-28 LAB — BASIC METABOLIC PANEL
Calcium: 8.1 mg/dL — ABNORMAL LOW (ref 8.4–10.5)
Chloride: 99 mEq/L (ref 96–112)
Creatinine, Ser: 0.5 mg/dL (ref 0.50–1.10)
GFR calc Af Amer: >90 mL/min (ref >90)

## 2012-04-28 LAB — CBC
Platelets: 220 10*3/uL (ref 150–400)
RDW: 13.7 % (ref 11.5–15.5)
WBC: 16.2 10*3/uL — ABNORMAL HIGH (ref 4.0–10.5)

## 2012-04-28 MED ORDER — FLEET ENEMA 7-19 GM/118ML RE ENEM
1.0000 | ENEMA | Freq: Once | RECTAL | Status: DC
  Filled 2012-04-28: qty 1

## 2012-04-28 MED ORDER — CEFUROXIME AXETIL 250 MG PO TABS
500.0000 mg | ORAL_TABLET | Freq: Two times a day (BID) | ORAL | Status: DC
  Filled 2012-04-28 (×2): qty 2

## 2012-04-28 MED ORDER — CEFUROXIME AXETIL 500 MG PO TABS: 500.0000 mg | ORAL_TABLET | Freq: Two times a day (BID) | ORAL | Status: DC

## 2012-04-28 NOTE — Progress Notes (Signed)
Physical Therapy Evaluation Patient Details Name: Denise Lynn MRN: [id number] DOB: 31-Oct-1918 Today's Date: 04/28/2012 Time: 4098-1191 PT Time Calculation (min): 26 min  PT Assessment / Plan / Recommendation Clinical Impression  78 yo admitted after being found alone at home lying in her own feces, failing to thrive; Presents with decr functional mobility; Will benefit frim PT to maximize safety with functional mobility, and to facilitate dc planning; Will need a higher level of care    PT Assessment  Patient needs continued PT services    Follow Up Recommendations  SNF;Supervision/Assistance - 24 hour    Does the patient have the potential to tolerate intense rehabilitation      Barriers to Discharge Decreased caregiver support Cannot take care of herself    Equipment Recommendations  Rolling walker with 5" wheels;3 in 1 bedside comode    Recommendations for Other Services     Frequency Min 3X/week    Precautions / Restrictions Precautions Precautions: Fall   Pertinent Vitals/Pain no apparent distress       Mobility  Bed Mobility Bed Mobility: Rolling Right;Right Sidelying to Sit;Sitting - Scoot to [place] of Bed Rolling Right: 2: Max assist;With rail Right Sidelying to Sit: 2: Max assist;With rails Sitting - Scoot to [place] of Bed: 2: Max assist;With rail Details for Bed Mobility Assistance: Requiring max verbal and tactile cueing for initiation of task; it seems pt is not able to fully understand the tasks that are asked of her; Noted small red area at sacrum, notified RN and repositioned pt OOB for pressure relief Transfers Transfers: Sit to Stand;Stand to Sit;Stand Pivot Transfers Sit to Stand: 1: +2 Total assist Sit to Stand: Patient Percentage: 30% Stand to Sit: 1: +2 Total assist Stand to Sit: Patient Percentage: 30% Stand Pivot Transfers: 1: +2 Total assist Stand Pivot Transfers: Patient Percentage: 30% Details for Transfer Assistance: Requiring +2 assist  for safety with transfers; Max verbal and tactile cueing for initiation, also heavily pointed out chair (which was our destination); Noted some LE muscle activation in response to center of mass being shifted over feet Ambulation/Gait Ambulation/Gait Assistance: Other (comment) (Unable today)    Shoulder Instructions     Exercises     PT Diagnosis: Difficulty walking;Generalized weakness  PT Problem List: Decreased strength;Decreased activity tolerance;Decreased balance;Decreased mobility;Decreased coordination;Decreased cognition;Decreased knowledge of use of DME PT Treatment Interventions: DME instruction;Gait training;Functional mobility training;Therapeutic activities;Therapeutic exercise;Patient/family education   PT Goals Acute Rehab PT Goals PT Goal Formulation: With patient/family Time For Goal Achievement: 05/12/12 Potential to Achieve Goals: Fair Pt will go Supine/Side to Sit: with supervision PT Goal: Supine/Side to Sit - Progress: Goal set today Pt will go Sit to Supine/Side: with supervision PT Goal: Sit to Supine/Side - Progress: Goal set today Pt will go Sit to Stand: with supervision PT Goal: Sit to Stand - Progress: Goal set today Pt will go Stand to Sit: with supervision PT Goal: Stand to Sit - Progress: Goal set today Pt will Transfer Bed to Chair/Chair to Bed: with supervision PT Transfer Goal: Bed to Chair/Chair to Bed - Progress: Goal set today Pt will Ambulate: >150 feet;with min assist;with least restrictive assistive device PT Goal: Ambulate - Progress: Goal set today  Visit Information  Last PT Received On: 04/28/12 Assistance Needed: +2 (hopefully +1 soon)    Subjective Data  Subjective: Mostly non-verbal; but seemingly agreeable to OOB Patient Stated Goal: Pt unable to state; Daughter interested in SNF   Prior Functioning  Home Living Lives With: Alone  Available Help at Discharge: Other (Comment) (Unable to care for herself) Type of Home:  House Additional Comments: Pt found (after a few days of not being able to reach her by phone) lying in feces; Unable to care for  herself Prior Function Comments: Unable to care for herself Communication Communication: Other (comment) (No verbalizations in eval)    Cognition  Overall Cognitive Status: Impaired Area of Impairment: Attention;Following commands Arousal/Alertness: Awake/alert Orientation Level: Appears intact for tasks assessed Behavior During Session: Other (comment) Slow to move, requiring tactile cues for understanding of tasks we are requesting her to perform Current Attention Level: Focused Following Commands: Follows one step commands consistently    Extremity/Trunk Assessment Right Upper Extremity Assessment RUE ROM/Strength/Tone: Deficits Left Upper Extremity Assessment LUE ROM/Strength/Tone: Deficits Right Lower Extremity Assessment RLE ROM/Strength/Tone: Deficits RLE ROM/Strength/Tone Deficits: Generalized weakness; requiring +2 assist for sit to stand Left Lower Extremity Assessment LLE ROM/Strength/Tone: Deficits LLE ROM/Strength/Tone Deficits: Generalized weakness; requiring +2 assist for sit to stand   Balance Balance Balance Assessed: Yes Static Sitting Balance Static Sitting - Balance Support: Right upper extremity supported;Left upper extremity supported;Feet supported Static Sitting - Level of Assistance: 5: Stand by assistance Static Sitting - Comment/# of Minutes: EOB at least 5 minutes in prep for getting up; Pt's more alert sitting upright with more eye opening, facial expessions with appropriate reactions to jokes told in room  End of Session PT - End of Session Equipment Utilized During Treatment: Gait belt Activity Tolerance: Patient tolerated treatment well Patient left: in chair;with call bell/phone within reach;with family/visitor present (Breakfast setup in front of pt) Nurse Communication: Mobility status  GP     Olen Pel [place], [place] [phone]  04/28/2012, 9:59 AM

## 2012-04-28 NOTE — Progress Notes (Signed)
[facility] PROGRESS NOTE  Denise Lynn ZOX:[id number] DOB: Sep 13, 1918 DOA: 04/24/2012 PCP: No primary provider on file.  HPI/Subjective: Denies any specific complaint   Assessment/Plan:  Altered mental status  -Patient likely has delirium on top of dementia. -Patient is confused and disoriented but she is fully awake. -She has negative CT scan of the head, could be secondary to dehydration or acute illness.  Cellulitis/herpes zoster dermatitis  -No evidence of vesicular rash, I do not think this is shingles, I will discontinue the Valtrex. -She is on cefazolin for probable cellulitis. -She has maculopapular rash, this could be secondary to the irritation from feces. -She was on feces for past 3 days.  Leukocytosis  -May be due to herpes zoster/cellulitis  -Blood cultures x2 sets  -Valtrex and cefazolin as discussed  -Urinalysis does not suggest UTI.  Deconditioning  -PT/OT evaluation  -Family requests SNF placement.   Failure to thrive  -Check prealbumin  -Ensure supplementation   Abdominal pain  -May be constipation  -Abdominal x-ray showed no free air or obstruction.   Code Status: Full Family Communication:  Disposition Plan: Remains inpatient   Consultants:  None  Procedures:  None  Antibiotics:  Cefazolin started on 04/26/2012   Objective: Filed Vitals:   04/27/12 0445 04/27/12 1422 04/27/12 2052 04/28/12 0537  BP: 124/43 122/47 140/58 131/45  Pulse: 92 87 96 90  Temp: 98.1 F (36.7 C) 98.6 F (37 C) 99.3 F (37.4 C) 100.7 F (38.2 C)  TempSrc: Oral Oral Oral Oral  Resp: 16 20 16 18   Height:      Weight:      SpO2: 96% 99% 93% 94%    Intake/Output Summary (Last 24 hours) at 04/28/12 1349 Last data filed at 04/28/12 0529  Gross per 24 hour  Intake  747.5 ml  Output      0 ml  Net  747.5 ml   Filed Weights   04/26/12 1700  Weight: 57.607 kg (127 lb)    Exam:  General: Alert and awake, oriented x3, not in any acute  distress. HEENT: anicteric sclera, pupils reactive to light and accommodation, EOMI CVS: S1-S2 clear, no murmur rubs or gallops Chest: clear to auscultation bilaterally, no wheezing, rales or rhonchi Abdomen: soft nontender, nondistended, normal bowel sounds, no organomegaly Extremities: no cyanosis, clubbing or edema noted bilaterally Neuro: Cranial nerves II-XII intact, no focal neurological deficits  Data Reviewed: Basic Metabolic Panel:  Lab 04/28/12 8119 04/27/12 0700 04/26/12 0946 04/24/12 1313  NA 138 140 137 134*  K 3.9 3.4* 3.7 3.5  CL 99 102 96 96  CO2 29 30 34* 29  GLUCOSE 187* 187* 204* 172*  BUN 12 14 16 20   CREATININE 0.50 0.57 0.74 0.59  CALCIUM 8.1* 8.2* 8.7 8.8  MG -- -- -- --  PHOS -- -- -- --   Liver Function Tests:  Lab 04/27/12 0700  AST 18  ALT 13  ALKPHOS 58  BILITOT 0.2*  PROT 5.7*  ALBUMIN 1.9*   No results found for this basename: LIPASE:5,AMYLASE:5 in the last 168 hours No results found for this basename: AMMONIA:5 in the last 168 hours CBC:  Lab 04/28/12 0635 04/26/12 0946 04/24/12 1313  WBC 16.2* 15.2* 13.0*  NEUTROABS -- 13.0* --  HGB 12.6 12.5 13.7  HCT 38.7 38.1 40.6  MCV 93.5 94.1 90.8  PLT 220 253 279   Cardiac Enzymes:  Lab 04/26/12 1701 04/26/12 1100 04/24/12 1359  CKTOTAL -- -- 90  CKMB -- -- --  CKMBINDEX -- -- --  TROPONINI <0.30 <0.30 --   BNP (last 3 results) No results found for this basename: PROBNP:3 in the last 8760 hours CBG: No results found for this basename: GLUCAP:5 in the last 168 hours  Recent Results (from the past 240 hour(s))  CULTURE, BLOOD (ROUTINE X 2)     Status: Normal (Preliminary result)   Collection Time   04/26/12 11:00 AM      Component Value Range Status Comment   Specimen Description BLOOD RIGHT ARM   Final    Special Requests BOTTLES DRAWN AEROBIC AND ANAEROBIC 10CC   Final    Culture  Setup Time 04/26/2012 16:12   Final    Culture     Final    Value:        BLOOD CULTURE RECEIVED NO  GROWTH TO DATE CULTURE WILL BE HELD FOR 5 DAYS BEFORE ISSUING A FINAL NEGATIVE REPORT   Report Status PENDING   Incomplete   CULTURE, BLOOD (ROUTINE X 2)     Status: Normal (Preliminary result)   Collection Time   04/26/12 11:07 AM      Component Value Range Status Comment   Specimen Description BLOOD LEFT ARM   Final    Special Requests BOTTLES DRAWN AEROBIC AND ANAEROBIC 10CC   Final    Culture  Setup Time 04/26/2012 16:12   Final    Culture     Final    Value:        BLOOD CULTURE RECEIVED NO GROWTH TO DATE CULTURE WILL BE HELD FOR 5 DAYS BEFORE ISSUING A FINAL NEGATIVE REPORT   Report Status PENDING   Incomplete      Studies: Dg Abd Portable 2v  04/26/2012  *RADIOLOGY REPORT*  Clinical Data: Abdominal pain  PORTABLE ABDOMEN - 2 VIEW  Comparison: None.  Findings: Scattered air and stool throughout the bowel.  No definite free air on the decubitus view.  Lung bases clear. Atherosclerotic calcifications noted.  Diffuse degenerative changes of the spine and osteopenia.  IMPRESSION: Negative for obstruction or free air.   Original Report Authenticated By: Judie Petit. Shick, M.D.     Scheduled Meds:    . calcium-vitamin D  1 tablet Oral Daily  .  ceFAZolin (ANCEF) IV  1 g Intravenous Q8H  . enoxaparin (LOVENOX) injection  40 mg Subcutaneous Q24H  . feeding supplement  237 mL Oral BID BM  . multivitamin with minerals  1 tablet Oral Daily  . [COMPLETED] potassium chloride  40 mEq Oral Once  . simvastatin  40 mg Oral q1800  . sodium chloride  3 mL Intravenous Q12H  . sodium phosphate  1 enema Rectal Once  . valACYclovir  1,000 mg Oral BID   Continuous Infusions:    . sodium chloride 75 mL/hr at 04/26/12 1705    Active Problems:  Cellulitis of flank  Altered mental status  Hyperlipidemia  Leukocytosis  Failure to thrive    Time spent: 36 minutes    [facility] A  [facility] Pager [phone]. If 8PM-8AM, please contact night-coverage at [email or web address], password  [facility] 04/28/2012, 1:49 PM  LOS: 4 days

## 2012-04-28 NOTE — Discharge Summary (Signed)
Physician Discharge Summary  WESTLYN GLAZA HQI:[id number] DOB: 01/17/1919 DOA: 04/24/2012  PCP: No primary provider on file.  Admit date: 04/24/2012 Discharge date: 04/28/2012  Time spent: 40 minutes  Recommendations for Outpatient Follow-up:  1. Followup with primary care physician in one week.  Discharge Diagnoses:  Active Problems:  Cellulitis of flank  Altered mental status  Hyperlipidemia  Leukocytosis  Failure to thrive   Discharge Condition: Stable  Diet recommendation: Regular diet  Filed Weights   04/26/12 1700  Weight: 57.607 kg (127 lb)    History of present illness:  78 year old female with a history of hyperlipidemia presented with altered mental status. The patient is presently confused; therefore, this history is from the patient's daughter who is at the bedside. The patient lives by herself. The patient's son, [name], checks up on her every 7-10 days. However, the patient has had a significant decline in the past 2 months in terms of her physical stability and appetite. The patient's son found the patient on Saturday, 04/22/2012, in her own feces. The patient was arousable, but was confused when she was aroused. The patient's daughter, [name], lives near [place] and had not been able to contact the patient by telephone for a period of 3 days. At that time, [name] contacted [name], the son, to go over to the patient's home where he found her in her own feces. The patient was subsequently brought to the emergency department for further evaluation. Apparently, the patient has not been able to fix her own food for at least 2 months. The patient's son and daughter would cook for the patient intermittently and stored food in the refrigerator with instructions for the patient to eat or keep up the food. However, the patient has not followed these instructions. Instead, the patient has been eating mostly nonperishable type items for the past 2-3 months. In addition, there was also  some history that the patient has had increasing difficulty getting around. The patient went to see her primary care provider, Dr. [name], 2 weeks ago.according to the daughter, the patient was much more alert at that time. However, the daughter does note that the patient has had a gradual decline over the last few months. The son and the daughter have to help the patient with her checkbook.    Hospital Course:   1. Altered mental status: Patient has acute delirium on top of chronic dementia, this is possibly secondary to multiple causes including dehydration and volume depletion and cellulitis. Per family patient has progressive decline in her functional status in the past 2-3 month, she is being more forgetful and unable to do her ADLs. Patient is awake and alert, delirium resolved she is oriented only to person not to place and time.  2. Cellulitis: Patient has maculopapular rash in her back, as mentioned above she was found unable to move covered with feces, this is said to be for at least 3 days. Patient has irritation in her back, initially she was covered for shingles with Valtrex and with cefazolin for secondary infections. The rash crosses midline, without any fluid filled vesicles. I do not think there is any shingles. Valtrex discontinued for the time of discharge and Ceftin for 5 more days for presumed cellulitis.   3. Leukocytosis: Likely secondary to stress the margination from not able to move for 3 days then as well as a cellulitis. Patient chest x-ray and urinalysis were negative for acute infections. Blood cultures x2 were ordered and its NGTD. Patient was started  on cefazolin Valtrex at the time of admission, Valtrex was dropped the cefazolin was switched to Ceftin at the time of discharge.  4. Deconditioning: PT/OT evaluated the patient, and they both recommended to discharge to skilled nursing facility because of cognitive and physical decondition. Patient also has adult failure to  thrive her prealbumin is 5.5. She will need dietitian to follow her up in the SNF.  5. Abdominal pain: Patient had some bloating, abdominal x-rays were ordered showed normal gas pattern, patient was complaining about constipation and Fleet enema was ordered. Patient abdominal pain was much less after that, she denies any abdominal pain, she denies any nausea or vomiting.  Procedures:  None  Consultations:  None  Discharge Exam: Filed Vitals:   04/27/12 0445 04/27/12 1422 04/27/12 2052 04/28/12 0537  BP: 124/43 122/47 140/58 131/45  Pulse: 92 87 96 90  Temp: 98.1 F (36.7 C) 98.6 F (37 C) 99.3 F (37.4 C) 100.7 F (38.2 C)  TempSrc: Oral Oral Oral Oral  Resp: 16 20 16 18   Height:      Weight:      SpO2: 96% 99% 93% 94%   General: Alert and awake, oriented x3, not in any acute distress. HEENT: anicteric sclera, pupils reactive to light and accommodation, EOMI CVS: S1-S2 clear, no murmur rubs or gallops Chest: clear to auscultation bilaterally, no wheezing, rales or rhonchi Abdomen: soft nontender, nondistended, normal bowel sounds, no organomegaly Extremities: no cyanosis, clubbing or edema noted bilaterally Neuro: Cranial nerves II-XII intact, no focal neurological deficits   Discharge Instructions  Discharge Orders    Future Orders Please Complete By Expires   Increase activity slowly          Medication List     As of 04/28/2012  2:00 PM    TAKE these medications         cefUROXime 500 MG tablet   Commonly known as: CEFTIN   Take 1 tablet (500 mg total) by mouth 2 (two) times daily with a meal.      multivitamin with minerals Tabs   Take 1 tablet by mouth daily.      pravastatin 40 MG tablet   Commonly known as: PRAVACHOL   Take 40 mg by mouth daily.      VIACTIV 500-500-40 MG-UNT-MCG Chew   Generic drug: Calcium-Vitamin D-Vitamin K   Chew 1 tablet by mouth daily.          The results of significant diagnostics from this hospitalization  (including imaging, microbiology, ancillary and laboratory) are listed below for reference.    Significant Diagnostic Studies: Ct Head Wo Contrast  04/24/2012  *RADIOLOGY REPORT*  Clinical Data: Altered mental status.  CT HEAD WITHOUT CONTRAST  Technique:  Contiguous axial images were obtained from the base of the skull through the vertex without contrast.  Comparison: None.  Findings: 1.9 x 1.5 cm rounded, densely calcified mass along the tentorium on the left, measured on image #9.  No associated mass effect or edema.  Diffusely enlarged ventricles and subarachnoid spaces.  Patchy white matter low density in both cerebral hemispheres.  Small amount of bilateral basal ganglia calcification.  No intracranial hemorrhage or CT evidence of acute infarction.  Bilateral hyperostosis frontalis.  The included portions of the paranasal sinuses are normally pneumatized.  IMPRESSION:  1.  No acute abnormality. 2.  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 3.  Atrophy and chronic small vessel white matter ischemic changes.   Original Report Authenticated By: Beckie Salts,  M.D.    Dg Chest Portable 1 View  04/25/2012  *RADIOLOGY REPORT*  Clinical Data: Altered mental status  PORTABLE CHEST - 1 VIEW  Comparison: None.  Findings: Cardiomediastinal silhouette is unremarkable.  Mild elevation of the right hemidiaphragm.  Mild right basilar atelectasis.  No acute infiltrate or pulmonary edema.  Mild degenerative changes thoracic spine.  IMPRESSION: No active disease.  Mild elevation of the right hemidiaphragm with right basilar atelectasis.   Original Report Authenticated By: Natasha Mead, M.D.    Dg Abd Portable 2v  04/26/2012  *RADIOLOGY REPORT*  Clinical Data: Abdominal pain  PORTABLE ABDOMEN - 2 VIEW  Comparison: None.  Findings: Scattered air and stool throughout the bowel.  No definite free air on the decubitus view.  Lung bases clear. Atherosclerotic calcifications noted.  Diffuse degenerative changes of the spine and  osteopenia.  IMPRESSION: Negative for obstruction or free air.   Original Report Authenticated By: Judie Petit. Miles Costain, M.D.     Microbiology: Recent Results (from the past 240 hour(s))  CULTURE, BLOOD (ROUTINE X 2)     Status: Normal (Preliminary result)   Collection Time   04/26/12 11:00 AM      Component Value Range Status Comment   Specimen Description BLOOD RIGHT ARM   Final    Special Requests BOTTLES DRAWN AEROBIC AND ANAEROBIC 10CC   Final    Culture  Setup Time 04/26/2012 16:12   Final    Culture     Final    Value:        BLOOD CULTURE RECEIVED NO GROWTH TO DATE CULTURE WILL BE HELD FOR 5 DAYS BEFORE ISSUING A FINAL NEGATIVE REPORT   Report Status PENDING   Incomplete   CULTURE, BLOOD (ROUTINE X 2)     Status: Normal (Preliminary result)   Collection Time   04/26/12 11:07 AM      Component Value Range Status Comment   Specimen Description BLOOD LEFT ARM   Final    Special Requests BOTTLES DRAWN AEROBIC AND ANAEROBIC 10CC   Final    Culture  Setup Time 04/26/2012 16:12   Final    Culture     Final    Value:        BLOOD CULTURE RECEIVED NO GROWTH TO DATE CULTURE WILL BE HELD FOR 5 DAYS BEFORE ISSUING A FINAL NEGATIVE REPORT   Report Status PENDING   Incomplete      Labs: Basic Metabolic Panel:  Lab 04/28/12 1610 04/27/12 0700 04/26/12 0946 04/24/12 1313  NA 138 140 137 134*  K 3.9 3.4* 3.7 3.5  CL 99 102 96 96  CO2 29 30 34* 29  GLUCOSE 187* 187* 204* 172*  BUN 12 14 16 20   CREATININE 0.50 0.57 0.74 0.59  CALCIUM 8.1* 8.2* 8.7 8.8  MG -- -- -- --  PHOS -- -- -- --   Liver Function Tests:  Lab 04/27/12 0700  AST 18  ALT 13  ALKPHOS 58  BILITOT 0.2*  PROT 5.7*  ALBUMIN 1.9*   No results found for this basename: LIPASE:5,AMYLASE:5 in the last 168 hours No results found for this basename: AMMONIA:5 in the last 168 hours CBC:  Lab 04/28/12 0635 04/26/12 0946 04/24/12 1313  WBC 16.2* 15.2* 13.0*  NEUTROABS -- 13.0* --  HGB 12.6 12.5 13.7  HCT 38.7 38.1 40.6  MCV  93.5 94.1 90.8  PLT 220 253 279   Cardiac Enzymes:  Lab 04/26/12 1701 04/26/12 1100 04/24/12 1359  CKTOTAL -- -- 90  CKMB -- -- --  CKMBINDEX -- -- --  TROPONINI <0.30 <0.30 --   BNP: BNP (last 3 results) No results found for this basename: PROBNP:3 in the last 8760 hours CBG: No results found for this basename: GLUCAP:5 in the last 168 hours     Signed:  Aristotle Lieb A  [facility] 04/28/2012, 2:00 PM

## 2012-04-28 NOTE — Progress Notes (Signed)
SNF bed accepted at [facility] and is available for transfer Saturday- patient and daughter aware- CSW to send d/c summary today once completed by MD to SNF and have weekend CSW f/u for final d/c planning- Reece Levy, MSW, [employer] [phone]

## 2012-04-29 NOTE — Discharge Summary (Signed)
Physician Discharge Summary  Denise Lynn ZOX:[id number] DOB: 12/11/1918 DOA: 04/24/2012  PCP: No primary provider on file.  Admit date: 04/24/2012 Discharge date: 04/29/2012  Time spent: 40 minutes  Recommendations for Outpatient Follow-up:  1. Followup with primary care physician in one week.  Discharge Diagnoses:  Active Problems:  Cellulitis of flank  Altered mental status  Hyperlipidemia  Leukocytosis  Failure to thrive   Discharge Condition: Lynn  Diet recommendation: Regular diet  Filed Weights   04/26/12 1700  Weight: 57.607 kg (127 lb)    History of present illness:  78 year old female with a history of hyperlipidemia presented with altered mental status. The patient is presently confused; therefore, this history is from the patient's daughter who is at the bedside. The patient lives by herself. The patient's son, Denise Lynn, checks up on her every 7-10 days. However, the patient has had a significant decline in the past 2 months in terms of her physical stability and appetite. The patient's son found the patient on Saturday, 04/22/2012, in her own feces. The patient was arousable, but was confused when she was aroused. The patient's daughter, Denise Lynn, lives near [place] and had not been able to contact the patient by telephone for a period of 3 days. At that time, Denise Lynn contacted Denise Lynn, the son, to go over to the patient's home where he found her in her own feces. The patient was subsequently brought to the emergency department for further evaluation. Apparently, the patient has not been able to fix her own food for at least 2 months. The patient's son and daughter would cook for the patient intermittently and stored food in the refrigerator with instructions for the patient to eat or keep up the food. However, the patient has not followed these instructions. Instead, the patient has been eating mostly nonperishable type items for the past 2-3 months. In addition, there was also  some history that the patient has had increasing difficulty getting around. The patient went to see her primary care provider, Dr. Leslie Lynn, 2 weeks ago.according to the daughter, the patient was much more alert at that time. However, the daughter does note that the patient has had a gradual decline over the last few months. The son and the daughter have to help the patient with her checkbook.    Hospital Course:   1. Altered mental status: Patient has acute delirium on top of chronic dementia, this is possibly secondary to multiple causes including dehydration and volume depletion and cellulitis. Per family patient has progressive decline in her functional status in the past 2-3 month, she is being more forgetful and unable to do her ADLs. Patient is awake and alert, delirium resolved she is oriented only to person not to place and time.  2. Cellulitis: Patient has maculopapular rash in her back, as mentioned above she was found unable to move covered with feces, this is said to be for at least 3 days. Patient has irritation in her back, initially she was covered for shingles with Valtrex and with cefazolin for secondary infections. The rash crosses midline, without any fluid filled vesicles. I do not think there is any shingles. Valtrex discontinued for the time of discharge and Ceftin for 5 more days for presumed cellulitis.   3. Leukocytosis: Likely secondary to stress the margination from not able to move for 3 days then as well as a cellulitis. Patient chest x-ray and urinalysis were negative for acute infections. Blood cultures x2 were ordered and its NGTD. Patient was started  on cefazolin Valtrex at the time of admission, Valtrex was dropped the cefazolin was switched to Ceftin at the time of discharge.  4. Deconditioning: PT/OT evaluated the patient, and they both recommended to discharge to skilled nursing facility because of cognitive and physical decondition. Patient also has adult failure to  thrive her prealbumin is 5.5. She will need dietitian to follow her up in the SNF.  5. Abdominal pain: Patient had some bloating, abdominal x-rays were ordered showed normal gas pattern with increased stool burden, Fleet Enema was ordered, but patient had a small bowel movement before that and so elected not to give. Patient needs bowel regimen, recommend MiraLax daily.  Procedures:  None  Consultations:  None  Discharge Exam: Filed Vitals:   04/28/12 0537 04/28/12 1500 04/28/12 2023 04/29/12 0600  BP: 131/45 128/58 123/52 118/48  Pulse: 90 84 100 90  Temp: 100.7 F (38.2 C) 98.7 F (37.1 C) 98.2 F (36.8 C) 98.4 F (36.9 C)  TempSrc: Oral Oral    Resp: 18 18 19 19   Height:      Weight:      SpO2: 94% 95% 96% 97%   General: Alert and awake, oriented x3, not in any acute distress. HEENT: anicteric sclera, pupils reactive to light and accommodation, EOMI CVS: S1-S2 clear, no murmur rubs or gallops Chest: clear to auscultation bilaterally, no wheezing, rales or rhonchi Abdomen: soft nontender, nondistended, normal bowel sounds, no organomegaly Extremities: no cyanosis, clubbing or edema noted bilaterally Neuro: Cranial nerves II-XII intact, no focal neurological deficits   Discharge Instructions      Discharge Orders    Future Orders Please Complete By Expires   Increase activity slowly          Medication List     As of 04/29/2012 11:03 AM    TAKE these medications         cefUROXime 500 MG tablet   Commonly known as: CEFTIN   Take 1 tablet (500 mg total) by mouth 2 (two) times daily with a meal.      multivitamin with minerals Tabs   Take 1 tablet by mouth daily.      pravastatin 40 MG tablet   Commonly known as: PRAVACHOL   Take 40 mg by mouth daily.      VIACTIV 500-500-40 MG-UNT-MCG Chew   Generic drug: Calcium-Vitamin D-Vitamin K   Chew 1 tablet by mouth daily.            The results of significant diagnostics from this hospitalization  (including imaging, microbiology, ancillary and laboratory) are listed below for reference.    Significant Diagnostic Studies: Ct Head Wo Contrast  04/24/2012  *RADIOLOGY REPORT*  Clinical Data: Altered mental status.  CT HEAD WITHOUT CONTRAST  Technique:  Contiguous axial images were obtained from the base of the skull through the vertex without contrast.  Comparison: None.  Findings: 1.9 x 1.5 cm rounded, densely calcified mass along the tentorium on the left, measured on image #9.  No associated mass effect or edema.  Diffusely enlarged ventricles and subarachnoid spaces.  Patchy white matter low density in both cerebral hemispheres.  Small amount of bilateral basal ganglia calcification.  No intracranial hemorrhage or CT evidence of acute infarction.  Bilateral hyperostosis frontalis.  The included portions of the paranasal sinuses are normally pneumatized.  IMPRESSION:  1.  No acute abnormality. 2.  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 3.  Atrophy and chronic small vessel white matter ischemic changes.  Original Report Authenticated By: Beckie Salts, M.D.    Dg Chest Portable 1 View  04/25/2012  *RADIOLOGY REPORT*  Clinical Data: Altered mental status  PORTABLE CHEST - 1 VIEW  Comparison: None.  Findings: Cardiomediastinal silhouette is unremarkable.  Mild elevation of the right hemidiaphragm.  Mild right basilar atelectasis.  No acute infiltrate or pulmonary edema.  Mild degenerative changes thoracic spine.  IMPRESSION: No active disease.  Mild elevation of the right hemidiaphragm with right basilar atelectasis.   Original Report Authenticated By: Natasha Mead, M.D.    Dg Abd Portable 2v  04/26/2012  *RADIOLOGY REPORT*  Clinical Data: Abdominal pain  PORTABLE ABDOMEN - 2 VIEW  Comparison: None.  Findings: Scattered air and stool throughout the bowel.  No definite free air on the decubitus view.  Lung bases clear. Atherosclerotic calcifications noted.  Diffuse degenerative changes of the spine and  osteopenia.  IMPRESSION: Negative for obstruction or free air.   Original Report Authenticated By: Judie Petit. Miles Costain, M.D.     Microbiology: Recent Results (from the past 240 hour(s))  CULTURE, BLOOD (ROUTINE X 2)     Status: Normal (Preliminary result)   Collection Time   04/26/12 11:00 AM      Component Value Range Status Comment   Specimen Description BLOOD RIGHT ARM   Final    Special Requests BOTTLES DRAWN AEROBIC AND ANAEROBIC 10CC   Final    Culture  Setup Time 04/26/2012 16:12   Final    Culture     Final    Value:        BLOOD CULTURE RECEIVED NO GROWTH TO DATE CULTURE WILL BE HELD FOR 5 DAYS BEFORE ISSUING A FINAL NEGATIVE REPORT   Report Status PENDING   Incomplete   CULTURE, BLOOD (ROUTINE X 2)     Status: Normal (Preliminary result)   Collection Time   04/26/12 11:07 AM      Component Value Range Status Comment   Specimen Description BLOOD LEFT ARM   Final    Special Requests BOTTLES DRAWN AEROBIC AND ANAEROBIC 10CC   Final    Culture  Setup Time 04/26/2012 16:12   Final    Culture     Final    Value:        BLOOD CULTURE RECEIVED NO GROWTH TO DATE CULTURE WILL BE HELD FOR 5 DAYS BEFORE ISSUING A FINAL NEGATIVE REPORT   Report Status PENDING   Incomplete      Labs: Basic Metabolic Panel:  Lab 04/28/12 0981 04/27/12 0700 04/26/12 0946 04/24/12 1313  NA 138 140 137 134*  K 3.9 3.4* 3.7 3.5  CL 99 102 96 96  CO2 29 30 34* 29  GLUCOSE 187* 187* 204* 172*  BUN 12 14 16 20   CREATININE 0.50 0.57 0.74 0.59  CALCIUM 8.1* 8.2* 8.7 8.8  MG -- -- -- --  PHOS -- -- -- --   Liver Function Tests:  Lab 04/27/12 0700  AST 18  ALT 13  ALKPHOS 58  BILITOT 0.2*  PROT 5.7*  ALBUMIN 1.9*   No results found for this basename: LIPASE:5,AMYLASE:5 in the last 168 hours No results found for this basename: AMMONIA:5 in the last 168 hours CBC:  Lab 04/28/12 0635 04/26/12 0946 04/24/12 1313  WBC 16.2* 15.2* 13.0*  NEUTROABS -- 13.0* --  HGB 12.6 12.5 13.7  HCT 38.7 38.1 40.6  MCV  93.5 94.1 90.8  PLT 220 253 279   Cardiac Enzymes:  Lab 04/26/12 1701 04/26/12 1100 04/24/12 1359  CKTOTAL -- -- 90  CKMB -- -- --  CKMBINDEX -- -- --  TROPONINI <0.30 <0.30 --   BNP: BNP (last 3 results) No results found for this basename: PROBNP:3 in the last 8760 hours CBG: No results found for this basename: GLUCAP:5 in the last 168 hours     Signed:  Troyce Febo A  [facility] 04/29/2012, 11:03 AM

## 2012-04-29 NOTE — Clinical Social Work Note (Signed)
CSW was consulted to complete discharge of patient. Pt to transfer to [facility] today via [facility] at [phone]. [facility] and family are aware of d/c. D/C packet complete with chart copy, signed FL2, and signed hard Rx.  CSW signing off as no other CSW needs identified at this time.  Lia Foyer, LCSWA [facility] Clinical Social Worker Contact #: [phone] (weekend)

## 2012-04-29 NOTE — Progress Notes (Signed)
[facility] PROGRESS NOTE  Denise Lynn BJY:[id number] DOB: [birth year] DOA: 04/24/2012 PCP: No primary provider on file.  HPI/Subjective: Denies any specific complaint   Assessment/Plan:  Altered mental status  -Patient likely has delirium on top of dementia. -Patient is confused and disoriented but she is fully awake. -She has negative CT scan of the head, could be secondary to dehydration or acute illness.  Cellulitis/herpes zoster dermatitis  -No evidence of vesicular rash, I do not think this is shingles, I will discontinue the Valtrex. -She is on cefazolin for probable cellulitis. -She has maculopapular rash, this could be secondary to the irritation from feces. -She was on feces for past 3 days.  Leukocytosis  -May be due to herpes zoster/cellulitis  -Blood cultures x2 sets  -Valtrex and cefazolin as discussed  -Urinalysis does not suggest UTI.  Deconditioning  -PT/OT evaluation  -Family requests SNF placement.   Failure to thrive  -Check prealbumin  -Ensure supplementation   Abdominal pain  -May be constipation  -Abdominal x-ray showed no free air or obstruction.   Code Status: Full Family Communication:  Disposition Plan: Remains inpatient   Consultants:  None  Procedures:  None  Antibiotics:  Cefazolin started on 04/26/2012   Objective: Filed Vitals:   04/28/12 0537 04/28/12 1500 04/28/12 2023 04/29/12 0600  BP: 131/45 128/58 123/52 118/48  Pulse: 90 84 100 90  Temp: 100.7 F (38.2 C) 98.7 F (37.1 C) 98.2 F (36.8 C) 98.4 F (36.9 C)  TempSrc: Oral Oral    Resp: 18 18 19 19   Height:      Weight:      SpO2: 94% 95% 96% 97%    Intake/Output Summary (Last 24 hours) at 04/29/12 1101 Last data filed at 04/29/12 0906  Gross per 24 hour  Intake    360 ml  Output      0 ml  Net    360 ml   Filed Weights   04/26/12 1700  Weight: 57.607 kg (127 lb)    Exam:  General: Alert and awake, oriented x3, not in any acute  distress. HEENT: anicteric sclera, pupils reactive to light and accommodation, EOMI CVS: S1-S2 clear, no murmur rubs or gallops Chest: clear to auscultation bilaterally, no wheezing, rales or rhonchi Abdomen: soft nontender, nondistended, normal bowel sounds, no organomegaly Extremities: no cyanosis, clubbing or edema noted bilaterally Neuro: Cranial nerves II-XII intact, no focal neurological deficits  Data Reviewed: Basic Metabolic Panel:  Lab 04/28/12 0865 04/27/12 0700 04/26/12 0946 04/24/12 1313  NA 138 140 137 134*  K 3.9 3.4* 3.7 3.5  CL 99 102 96 96  CO2 29 30 34* 29  GLUCOSE 187* 187* 204* 172*  BUN 12 14 16 20   CREATININE 0.50 0.57 0.74 0.59  CALCIUM 8.1* 8.2* 8.7 8.8  MG -- -- -- --  PHOS -- -- -- --   Liver Function Tests:  Lab 04/27/12 0700  AST 18  ALT 13  ALKPHOS 58  BILITOT 0.2*  PROT 5.7*  ALBUMIN 1.9*   No results found for this basename: LIPASE:5,AMYLASE:5 in the last 168 hours No results found for this basename: AMMONIA:5 in the last 168 hours CBC:  Lab 04/28/12 0635 04/26/12 0946 04/24/12 1313  WBC 16.2* 15.2* 13.0*  NEUTROABS -- 13.0* --  HGB 12.6 12.5 13.7  HCT 38.7 38.1 40.6  MCV 93.5 94.1 90.8  PLT 220 253 279   Cardiac Enzymes:  Lab 04/26/12 1701 04/26/12 1100 04/24/12 1359  CKTOTAL -- -- 90  CKMB -- -- --  CKMBINDEX -- -- --  TROPONINI <0.30 <0.30 --   BNP (last 3 results) No results found for this basename: PROBNP:3 in the last 8760 hours CBG: No results found for this basename: GLUCAP:5 in the last 168 hours  Recent Results (from the past 240 hour(s))  CULTURE, BLOOD (ROUTINE X 2)     Status: Normal (Preliminary result)   Collection Time   04/26/12 11:00 AM      Component Value Range Status Comment   Specimen Description BLOOD RIGHT ARM   Final    Special Requests BOTTLES DRAWN AEROBIC AND ANAEROBIC 10CC   Final    Culture  Setup Time 04/26/2012 16:12   Final    Culture     Final    Value:        BLOOD CULTURE RECEIVED NO  GROWTH TO DATE CULTURE WILL BE HELD FOR 5 DAYS BEFORE ISSUING A FINAL NEGATIVE REPORT   Report Status PENDING   Incomplete   CULTURE, BLOOD (ROUTINE X 2)     Status: Normal (Preliminary result)   Collection Time   04/26/12 11:07 AM      Component Value Range Status Comment   Specimen Description BLOOD LEFT ARM   Final    Special Requests BOTTLES DRAWN AEROBIC AND ANAEROBIC 10CC   Final    Culture  Setup Time 04/26/2012 16:12   Final    Culture     Final    Value:        BLOOD CULTURE RECEIVED NO GROWTH TO DATE CULTURE WILL BE HELD FOR 5 DAYS BEFORE ISSUING A FINAL NEGATIVE REPORT   Report Status PENDING   Incomplete      Studies: No results found.  Scheduled Meds:    . calcium-vitamin D  1 tablet Oral Daily  . enoxaparin (LOVENOX) injection  40 mg Subcutaneous Q24H  . feeding supplement  237 mL Oral BID BM  . multivitamin with minerals  1 tablet Oral Daily  . simvastatin  40 mg Oral q1800  . sodium chloride  3 mL Intravenous Q12H  . sodium phosphate  1 enema Rectal Once  . valACYclovir  1,000 mg Oral BID  . [DISCONTINUED]  ceFAZolin (ANCEF) IV  1 g Intravenous Q8H  . [DISCONTINUED] cefUROXime  500 mg Oral BID WC  . [DISCONTINUED] cefUROXime  500 mg Oral BID WC   Continuous Infusions:    . sodium chloride 75 mL/hr at 04/26/12 1705    Active Problems:  Cellulitis of flank  Altered mental status  Hyperlipidemia  Leukocytosis  Failure to thrive    Time spent: 36 minutes    [facility] A  [facility] Pager [phone]. If 8PM-8AM, please contact night-coverage at [email or web address], password [facility] 04/29/2012, 11:01 AM  LOS: 5 days

## 2012-05-02 LAB — CULTURE, BLOOD (ROUTINE X 2): Culture: NO GROWTH

## 2012-08-25 ENCOUNTER — Non-Acute Institutional Stay (SKILLED_NURSING_FACILITY): Payer: Medicare Other | Admitting: Internal Medicine

## 2012-08-25 DIAGNOSIS — F0390 Unspecified dementia without behavioral disturbance: Secondary | ICD-10-CM

## 2012-08-25 DIAGNOSIS — D638 Anemia in other chronic diseases classified elsewhere: Secondary | ICD-10-CM

## 2012-08-25 DIAGNOSIS — E46 Unspecified protein-calorie malnutrition: Secondary | ICD-10-CM

## 2012-08-25 DIAGNOSIS — K5900 Constipation, unspecified: Secondary | ICD-10-CM

## 2012-08-26 NOTE — Progress Notes (Signed)
PROGRESS NOTE  DATE: 08/25/12  FACILITY: [facility]  LEVEL OF CARE: SNF  Routine Visit  CHIEF COMPLAINT:  Manage dementia, protein calorie malnutrition and constipation  HISTORY OF PRESENT ILLNESS:  REASSESSMENT OF ONGOING PROBLEMS:  1.DEMENTIA: The dementia remaines stable and continues to function adequately in the current living environment with supervision.  The patient has had little changes in behavior. No complications noted from the medications presently being used.  Patient is a poor historian. She is on hospice.  2.CONSTIPATION: The constipation remains stable. No complications from the medications presently being used. Staff  denys ongoing constipation, abdominal pain, nausea or vomiting.  3. protein calorie malnutrition-patient is on multiple dietary supplements. On 05/05/12 pre-albumin was 6.5. 12/13 albumin level 2.4.  PAST MEDICAL HISTORY : Reviewed.  No changes.  CURRENT MEDICATIONS: Reviewed per [facility]  REVIEW OF SYSTEMS: Unobtainable due to dementia  PHYSICAL EXAMINATION  VS:  T 97.6              P 80            RR 21          BP 115/74             POX %                WT (Lb)  GENERAL: no acute distress, thin body habitus EYES: conjunctivae normal, sclerae normal, normal eye lids NECK: supple, trachea midline, no neck masses, no thyroid tenderness, no thyromegaly LYMPHATICS: no LAN in the neck, no supraclavicular LAN RESPIRATORY: breathing is even & unlabored, BS CTAB CARDIAC: RRR, no murmur,no extra heart sounds, no edema GI: abdomen soft, normal BS, no masses, no tenderness, no hepatomegaly, no splenomegaly PSYCHIATRIC: the patient is alert & oriented to person, affect & behavior appropriate  LABS/RADIOLOGY:  12/13 WBC 12.5, hemoglobin 11.4, MCV 90.3, platelets 360, albumin 2.4, total protein 5.5 otherwise liver profile normal, hemoglobin A1c 6.8, fasting lipid panel normal, vitamin D level 39  ASSESSMENT/PLAN:  1. protein calorie  malnutrition-continue supplements.  Check TSH. 2. dementia-advanced. Continue comfort measures. 3. constipation-adequately controlled. 4. leukocytosis-due to hospice status will not further investigate. 5. anemia of chronic disease-stable.  CPT CODE: 19147

## 2012-11-17 ENCOUNTER — Non-Acute Institutional Stay (SKILLED_NURSING_FACILITY): Payer: Medicare Other | Admitting: Internal Medicine

## 2012-11-17 DIAGNOSIS — D638 Anemia in other chronic diseases classified elsewhere: Secondary | ICD-10-CM

## 2012-11-17 DIAGNOSIS — K5900 Constipation, unspecified: Secondary | ICD-10-CM

## 2012-11-17 DIAGNOSIS — F0390 Unspecified dementia without behavioral disturbance: Secondary | ICD-10-CM

## 2012-11-17 DIAGNOSIS — E43 Unspecified severe protein-calorie malnutrition: Secondary | ICD-10-CM

## 2012-11-21 ENCOUNTER — Non-Acute Institutional Stay (SKILLED_NURSING_FACILITY): Payer: Medicare Other | Admitting: Adult Health

## 2012-11-21 DIAGNOSIS — R946 Abnormal results of thyroid function studies: Secondary | ICD-10-CM

## 2012-11-23 DIAGNOSIS — F0390 Unspecified dementia without behavioral disturbance: Secondary | ICD-10-CM | POA: Insufficient documentation

## 2012-11-23 DIAGNOSIS — E43 Unspecified severe protein-calorie malnutrition: Secondary | ICD-10-CM | POA: Insufficient documentation

## 2012-11-23 DIAGNOSIS — K5900 Constipation, unspecified: Secondary | ICD-10-CM | POA: Insufficient documentation

## 2012-11-23 DIAGNOSIS — D638 Anemia in other chronic diseases classified elsewhere: Secondary | ICD-10-CM | POA: Insufficient documentation

## 2012-11-23 NOTE — Progress Notes (Signed)
PROGRESS NOTE  DATE: 11/17/12  FACILITY: [facility]  LEVEL OF CARE: SNF  Routine Visit  CHIEF COMPLAINT:  Manage dementia, protein calorie malnutrition and constipation  HISTORY OF PRESENT ILLNESS:  REASSESSMENT OF ONGOING PROBLEMS:  DEMENTIA: The dementia remaines stable and continues to function adequately in the current living environment with supervision.  The patient has had little changes in behavior. No complications noted from the medications presently being used.  Patient is a poor historian. She is on hospice.  CONSTIPATION: The constipation remains stable. No complications from the medications presently being used. Staff  denys ongoing constipation, abdominal pain, nausea or vomiting.  protein calorie malnutrition-patient is on multiple dietary supplements. On 05/05/12 pre-albumin was 6.5. 12/13 albumin level 2.4.  PAST MEDICAL HISTORY : Reviewed.  No changes.  CURRENT MEDICATIONS: Reviewed per [facility]  REVIEW OF SYSTEMS: Unobtainable due to dementia  PHYSICAL EXAMINATION  VS:  T 98.4      P 73      RR 17     BP 113/69            POX %                WT (Lb)  GENERAL: no acute distress, thin body habitus NECK: supple, trachea midline, no neck masses, no thyroid tenderness, no thyromegaly RESPIRATORY: breathing is even & unlabored, BS CTAB CARDIAC: RRR, no murmur,no extra heart sounds, no edema GI: abdomen soft, normal BS, no masses, no tenderness, no hepatomegaly, no splenomegaly PSYCHIATRIC: the patient is alert & oriented to person, affect & behavior appropriate  LABS/RADIOLOGY:  12/13 WBC 12.5, hemoglobin 11.4, MCV 90.3, platelets 360, albumin 2.4, total protein 5.5 otherwise liver profile normal, hemoglobin A1c 6.8, fasting lipid panel normal, vitamin D level 39  ASSESSMENT/PLAN:  1. protein calorie malnutrition-continue supplements.  Check TSH. 2. dementia-advanced. Continue comfort measures. 3. constipation-adequately controlled. 4. leukocytosis-due  to hospice status will not further investigate. 5. anemia of chronic disease-stable.  CPT CODE: 14782

## 2012-12-18 ENCOUNTER — Non-Acute Institutional Stay (SKILLED_NURSING_FACILITY): Payer: Medicare Other | Admitting: Internal Medicine

## 2012-12-18 DIAGNOSIS — K5900 Constipation, unspecified: Secondary | ICD-10-CM

## 2012-12-18 DIAGNOSIS — E43 Unspecified severe protein-calorie malnutrition: Secondary | ICD-10-CM

## 2012-12-18 DIAGNOSIS — D72829 Elevated white blood cell count, unspecified: Secondary | ICD-10-CM

## 2012-12-18 DIAGNOSIS — F0390 Unspecified dementia without behavioral disturbance: Secondary | ICD-10-CM

## 2012-12-21 NOTE — Progress Notes (Signed)
PROGRESS NOTE  DATE: 12/18/12  FACILITY: [facility]  LEVEL OF CARE: SNF  Routine Visit  CHIEF COMPLAINT:  Manage dementia, protein calorie malnutrition and constipation  HISTORY OF PRESENT ILLNESS:  REASSESSMENT OF ONGOING PROBLEMS:  DEMENTIA: The dementia remaines stable and continues to function adequately in the current living environment with supervision.  The patient has had little changes in behavior. No complications noted from the medications presently being used.  Patient is a poor historian. She is on hospice.  CONSTIPATION: The constipation remains stable. No complications from the medications presently being used. Staff  deny ongoing constipation, abdominal pain, nausea or vomiting.  protein calorie malnutrition-patient is on multiple dietary supplements. On 05/05/12 pre-albumin was 6.5. 12/13 albumin level 2.4.  PAST MEDICAL HISTORY : Reviewed.  No changes.  CURRENT MEDICATIONS: Reviewed per [facility]  REVIEW OF SYSTEMS: Unobtainable due to dementia  PHYSICAL EXAMINATION  VS:  T 98.2   P 90      RR 24    BP 117/81      GENERAL: no acute distress, thin body habitus NECK: supple, trachea midline, no neck masses, no thyroid tenderness, no thyromegaly RESPIRATORY: breathing is even & unlabored, BS CTAB CARDIAC: RRR, no murmur,no extra heart sounds, no edema GI: abdomen soft, normal BS, no masses, no tenderness, no hepatomegaly, no splenomegaly PSYCHIATRIC: the patient is alert & oriented to person, affect & behavior appropriate  LABS/RADIOLOGY:  6/14 TSH 0.295, free T4-1 0.39, WBC 12.4 otherwise CBC normal, glucose 131, BUN 25, albumin 3.1 otherwise CMP normal, fasting lipid panel normal  12/13 WBC 12.5, hemoglobin 11.4, MCV 90.3, platelets 360, albumin 2.4, total protein 5.5 otherwise liver profile normal, hemoglobin A1c 6.8, fasting lipid panel normal, vitamin D level 39  ASSESSMENT/PLAN:  protein calorie malnutrition-continue  supplements. dementia-advanced. Continue comfort measures. constipation-adequately controlled. leukocytosis-due to hospice status will not further investigate. anemia of chronic disease-stable.  CPT CODE: 16109

## 2013-01-23 ENCOUNTER — Non-Acute Institutional Stay (SKILLED_NURSING_FACILITY): Payer: Medicare Other | Admitting: Adult Health

## 2013-01-23 ENCOUNTER — Encounter: Payer: Self-pay | Admitting: Adult Health

## 2013-01-23 DIAGNOSIS — F0390 Unspecified dementia without behavioral disturbance: Secondary | ICD-10-CM

## 2013-01-23 DIAGNOSIS — IMO0002 Reserved for concepts with insufficient information to code with codable children: Secondary | ICD-10-CM

## 2013-01-23 DIAGNOSIS — K5900 Constipation, unspecified: Secondary | ICD-10-CM

## 2013-01-23 DIAGNOSIS — R946 Abnormal results of thyroid function studies: Secondary | ICD-10-CM | POA: Insufficient documentation

## 2013-01-23 DIAGNOSIS — R627 Adult failure to thrive: Secondary | ICD-10-CM

## 2013-01-23 NOTE — Progress Notes (Signed)
Patient ID: Denise Lynn, female   DOB: 1918-10-12, 79 y.o.   MRN: [id number]       PROGRESS NOTE  DATE: 11/21/2012  FACILITY:  [facility]  LEVEL OF CARE: SNF (31)  Acute Visit  CHIEF COMPLAINT:  Manage abnormal TSH  HISTORY OF PRESENT ILLNESS: This is a 79 year old female who was noted to have low TSH = 0.295 . Patient has failure to thrive. No palpitations nor shortness of breath.  PAST MEDICAL HISTORY : Reviewed.  No changes.  CURRENT MEDICATIONS: Reviewed per [facility]  REVIEW OF SYSTEMS: On obtainable choosing to advanced Dementia   PHYSICAL EXAMINATION  VS:  T 98.7        P 73       RR 18       BP 123/69            WT 113 (Lb)  GENERAL: no acute distress, normal body habitus EYES: conjunctivae normal, sclerae normal, normal eye lids NECK: supple, trachea midline, no neck masses, no thyroid tenderness, no thyromegaly LYMPHATICS: no LAN in the neck, no supraclavicular LAN RESPIRATORY: breathing is even & unlabored, BS CTAB CARDIAC: RRR, no murmur,no extra heart sounds, no edema GI: abdomen soft, normal BS, no masses, no tenderness, no hepatomegaly, no splenomegaly PSYCHIATRIC: the patient is alert & disoriented   LABS/RADIOLOGY:  11/20/12 TSH 0.295 11/15/12 WBC 12.4 hemoglobin 12.7 hematocrit 36.4 sodium 142 potassium 3.8 glucose 131 BUN 25 creatinine 0.82 alkaline phosphatase 52 total protein 6.3 albumin 3.1 lipid profile normal 12/13 WBC 12.5, hemoglobin 11.4, MCV 90.3, platelets 360, albumin 2.4, total protein 5.5 otherwise liver profile normal, hemoglobin A1c 6.8, fasting lipid panel normal, vitamin D level 39   ASSESSMENT/PLAN:  Nonspecific abnormal results of thyroid function study - checked for total T4, T3 resin uptake and free T4 index   CPT CODE: 11914

## 2013-01-23 NOTE — Progress Notes (Signed)
Patient ID: Denise Lynn, female   DOB: 1919/04/22, 79 y.o.   MRN: [id number]       PROGRESS NOTE  DATE: 01/23/2013  FACILITY: Nursing Home Location: [facility]  LEVEL OF CARE: SNF (31)  Routine Visit  CHIEF COMPLAINT:  Manage Failure to thrive, Dementia and Constipation  HISTORY OF PRESENT ILLNESS:  REASSESSMENT OF ONGOING PROBLEM(S):  DEMENTIA: The dementia remaines stable and continues to function adequately in the current living environment with supervision.  The patient has had little changes in behavior. She is being followed by hospice  CONSTIPATION: The constipation remains stable. No complications from the medications presently being used. Patient denies ongoing constipation, abdominal pain, nausea or vomiting.   PAST MEDICAL HISTORY : Reviewed.  No changes.  CURRENT MEDICATIONS: Reviewed per [facility]  REVIEW OF SYSTEMS:  Unobtainable due to advanced dementia   PHYSICAL EXAMINATION  VS:  T 97       P 68      RR 20      BP 130/70         WT 116.6 (Lb)  GENERAL: no acute distress, normal body habitus NECK: supple, trachea midline, no neck masses, no thyroid tenderness, no thyromegaly LYMPHATICS: no LAN in the neck, no supraclavicular LAN RESPIRATORY: breathing is even & unlabored, BS CTAB CARDIAC: RRR, no murmur,no extra heart sounds, no edema PSYCHIATRIC: the patient is alert & oriented to person, affect & behavior appropriate  LABS/RADIOLOGY: 6/14 TSH 0.295, free T4-1 0.39, WBC 12.4 otherwise CBC normal, glucose 131, BUN 25, albumin 3.1 otherwise CMP normal, fasting lipid panel normal 12/13 WBC 12.5, hemoglobin 11.4, MCV 90.3, platelets 360, albumin 2.4, total protein 5.5 otherwise liver profile normal, hemoglobin A1c 6.8, fasting lipid panel normal, vitamin D level 39   ASSESSMENT/PLAN:  Dementia - stable; followed by hospice  Failure to thrive - continue supplementation  Constipation - no complaints  Nonspecific abnormal results of  thyroid function study - endocrinology consult was discontinued per family request; patient is comfort care   CPT CODE: 40981

## 2013-02-19 ENCOUNTER — Non-Acute Institutional Stay (SKILLED_NURSING_FACILITY): Payer: Medicare Other | Admitting: Adult Health

## 2013-02-19 DIAGNOSIS — R627 Adult failure to thrive: Secondary | ICD-10-CM

## 2013-02-19 DIAGNOSIS — IMO0002 Reserved for concepts with insufficient information to code with codable children: Secondary | ICD-10-CM

## 2013-02-19 DIAGNOSIS — F0390 Unspecified dementia without behavioral disturbance: Secondary | ICD-10-CM

## 2013-02-19 DIAGNOSIS — K5900 Constipation, unspecified: Secondary | ICD-10-CM

## 2013-02-19 NOTE — Progress Notes (Signed)
Patient ID: Denise Lynn, female   DOB: 1918-12-01, 79 y.o.   MRN: [id number]        PROGRESS NOTE  DATE:  02/19/13   FACILITY: Nursing Home Location: [facility]  LEVEL OF CARE: SNF (31)  Routine Visit  CHIEF COMPLAINT:  Manage Failure to thrive, Dementia and Constipation  HISTORY OF PRESENT ILLNESS:  REASSESSMENT OF ONGOING PROBLEM(S):  FTT: The failure to thrive remains stable.  No complications reported from the medication(s) currently being used.  Last weight: 110 lbs  DEMENTIA: The dementia remaines stable and continues to function adequately in the current living environment with supervision.  The patient has had little changes in behavior. She is being followed by hospice  PAST MEDICAL HISTORY : Reviewed.  No changes.  CURRENT MEDICATIONS: Reviewed per [facility]  REVIEW OF SYSTEMS:  Unobtainable due to advanced dementia   PHYSICAL EXAMINATION  VS:  T 96.5  P 80      RR 20      BP 117/58         WT 110 (Lb)  GENERAL: no acute distress, normal body habitus NECK:  Supple, trachea midline, no neck masses, no thyroid tenderness, no thyromegaly NECK: supple, trachea midline, no neck masses, no thyroid tenderness, no thyromegaly LYMPHATICS: no LAN in the neck, no supraclavicular LAN RESPIRATORY: breathing is even & unlabored, BS CTAB CARDIAC: RRR, no murmur,no extra heart sounds, no edema PSYCHIATRIC: the patient is alert & oriented to person, affect & behavior appropriate  LABS/RADIOLOGY: 6/14 TSH 0.295, free T4-1 0.39, WBC 12.4 otherwise CBC normal, glucose 131, BUN 25, albumin 3.1 otherwise CMP normal, fasting lipid panel normal 12/13 WBC 12.5, hemoglobin 11.4, MCV 90.3, platelets 360, albumin 2.4, total protein 5.5 otherwise liver profile normal, hemoglobin A1c 6.8, fasting lipid panel normal, vitamin D level 39   ASSESSMENT/PLAN:  Dementia - stable; followed by hospice  Failure to thrive - continue supplementation  Constipation - no  complaints   CPT CODE: 40981

## 2013-04-25 ENCOUNTER — Encounter: Payer: Self-pay | Admitting: Internal Medicine

## 2013-04-25 ENCOUNTER — Non-Acute Institutional Stay (SKILLED_NURSING_FACILITY): Payer: Medicare Other | Admitting: Internal Medicine

## 2013-04-25 DIAGNOSIS — D72829 Elevated white blood cell count, unspecified: Secondary | ICD-10-CM

## 2013-04-25 DIAGNOSIS — K5900 Constipation, unspecified: Secondary | ICD-10-CM

## 2013-04-25 DIAGNOSIS — E43 Unspecified severe protein-calorie malnutrition: Secondary | ICD-10-CM

## 2013-04-25 DIAGNOSIS — F0390 Unspecified dementia without behavioral disturbance: Secondary | ICD-10-CM

## 2013-04-25 NOTE — Progress Notes (Signed)
PROGRESS NOTE  DATE: 04/25/13  FACILITY: [facility]  LEVEL OF CARE: SNF  Routine Visit  CHIEF COMPLAINT:  Manage dementia, protein calorie malnutrition and constipation  HISTORY OF PRESENT ILLNESS:  REASSESSMENT OF ONGOING PROBLEMS:  DEMENTIA: The dementia remaines stable and continues to function adequately in the current living environment with supervision.  The patient has had little changes in behavior. No complications noted from the medications presently being used.  Patient is a poor historian. She is on hospice.  CONSTIPATION: The constipation remains stable. No complications from the medications presently being used. Staff  deny ongoing constipation, abdominal pain, nausea or vomiting.  protein calorie malnutrition-patient is on multiple dietary supplements. On 05/05/12 pre-albumin was 6.5. 12/13 albumin level 2.4.  PAST MEDICAL HISTORY : Reviewed.  No changes.  CURRENT MEDICATIONS: Reviewed per [facility]  REVIEW OF SYSTEMS: Unobtainable due to dementia  PHYSICAL EXAMINATION  VS:  T 97.6   P 88      RR 16    BP 124/67      GENERAL: no acute distress, thin body habitus NECK: supple, trachea midline, no neck masses, no thyroid tenderness, no thyromegaly RESPIRATORY: breathing is even & unlabored, BS CTAB CARDIAC: RRR, no murmur,no extra heart sounds, no edema GI: abdomen soft, normal BS, no masses, no tenderness, no hepatomegaly, no splenomegaly PSYCHIATRIC: the patient is alert & oriented to person, affect & behavior appropriate  LABS/RADIOLOGY:  6/14 TSH 0.295, free T4-1 0.39, WBC 12.4 otherwise CBC normal, glucose 131, BUN 25, albumin 3.1 otherwise CMP normal, fasting lipid panel normal  12/13 WBC 12.5, hemoglobin 11.4, MCV 90.3, platelets 360, albumin 2.4, total protein 5.5 otherwise liver profile normal, hemoglobin A1c 6.8, fasting lipid panel normal, vitamin D level 39  ASSESSMENT/PLAN:  protein calorie malnutrition-continue  supplements. dementia-advanced. Continue comfort measures. constipation-adequately controlled. leukocytosis-due to hospice status will not further investigate. anemia of chronic disease-stable.  CPT CODE: 16109

## 2013-06-28 ENCOUNTER — Non-Acute Institutional Stay (SKILLED_NURSING_FACILITY): Payer: Medicare Other | Admitting: Adult Health

## 2013-06-28 DIAGNOSIS — F0390 Unspecified dementia without behavioral disturbance: Secondary | ICD-10-CM

## 2013-06-28 DIAGNOSIS — E43 Unspecified severe protein-calorie malnutrition: Secondary | ICD-10-CM

## 2013-06-28 DIAGNOSIS — K5900 Constipation, unspecified: Secondary | ICD-10-CM

## 2013-06-28 NOTE — Progress Notes (Signed)
Patient ID: Denise Lynn, female   DOB: 12-26-1918, 80 y.o.   MRN: [id number]        PROGRESS NOTE  DATE: 06/28/13  FACILITY: [facility]  LEVEL OF CARE: SNF  Routine Visit  CHIEF COMPLAINT:  Manage dementia, protein calorie malnutrition and constipation  HISTORY OF PRESENT ILLNESS:  REASSESSMENT OF ONGOING PROBLEMS:  DEMENTIA: The dementia remaines stable and continues to function adequately in the current living environment with supervision.  The patient has had little changes in behavior. Patient is a poor historian. She is on hospice.  CONSTIPATION: The constipation remains stable. No complications from the medications presently being used. Staff  deny ongoing constipation, abdominal pain, nausea or vomiting.  protein calorie malnutrition-patient is on multiple dietary supplements. On 05/05/12 pre-albumin was 6.5. 12/13 albumin level 2.4.  PAST MEDICAL HISTORY : Reviewed.  No changes.  [facility]  REVIEW OF SYSTEMS: Unobtainable due to dementia  PHYSICAL EXAMINATION  VS:  T 97.2    P85        RR 16    BP 116/68        WT110  lbs  GENERAL: no acute distress, thin body habitus EYES:  PERRL, conj clear, no exudate NECK: supple, trachea midline, no neck masses, no thyroid tenderness, no thyromegaly RESPIRATORY: breathing is even & unlabored, BS CTAB CARDIAC: RRR, no murmur,no extra heart sounds, no edema GI: abdomen soft, normal BS, no masses, no tenderness, no hepatomegaly, no splenomegaly PSYCHIATRIC: the patient is alert & oriented to person, affect & behavior appropriate  LABS/RADIOLOGY:  6/14 TSH 0.295, free T4-1 0.39, WBC 12.4 otherwise CBC normal, glucose 131, BUN 25, albumin 3.1 otherwise CMP normal, fasting lipid panel normal  12/13 WBC 12.5, hemoglobin 11.4, MCV 90.3, platelets 360, albumin 2.4, total protein 5.5 otherwise liver profile normal, hemoglobin A1c 6.8, fasting lipid panel normal, vitamin D level  39  ASSESSMENT/PLAN:  protein calorie malnutrition-continue supplements. dementia-advanced. Continue comfort measures. constipation-adequately controlled.  CPT CODE: [id number]

## 2013-07-31 ENCOUNTER — Encounter: Payer: Self-pay | Admitting: *Deleted

## 2013-09-14 ENCOUNTER — Encounter: Payer: Self-pay | Admitting: Adult Health

## 2013-09-14 ENCOUNTER — Non-Acute Institutional Stay (SKILLED_NURSING_FACILITY): Payer: Medicare Other | Admitting: Adult Health

## 2013-09-14 DIAGNOSIS — E43 Unspecified severe protein-calorie malnutrition: Secondary | ICD-10-CM

## 2013-09-14 DIAGNOSIS — K5900 Constipation, unspecified: Secondary | ICD-10-CM

## 2013-09-14 DIAGNOSIS — F0390 Unspecified dementia without behavioral disturbance: Secondary | ICD-10-CM

## 2013-09-14 NOTE — Progress Notes (Signed)
Patient ID: Denise Lynn, female   DOB: [birth year], 80 y.o.   MRN: [id number]           PROGRESS NOTE  DATE: 09/14/13  FACILITY: [facility]  LEVEL OF CARE: SNF  Routine Visit  CHIEF COMPLAINT:  Manage dementia, protein calorie malnutrition and constipation  HISTORY OF PRESENT ILLNESS:  REASSESSMENT OF ONGOING PROBLEMS:  DEMENTIA: The dementia remaines stable and continues to function adequately in the current living environment with supervision.  The patient has had little changes in behavior. Patient is a poor historian. She is on hospice.  CONSTIPATION: The constipation remains stable. No complications from the medications presently being used. Staff  deny ongoing constipation, abdominal pain, nausea or vomiting.   PAST MEDICAL HISTORY : Reviewed.  No changes.  [facility]  REVIEW OF SYSTEMS: Unobtainable due to dementia  PHYSICAL EXAMINATION  GENERAL: no acute distress, thin body habitus NECK: supple, trachea midline, no neck masses, no thyroid tenderness, no thyromegaly RESPIRATORY: breathing is even & unlabored, BS CTAB CARDIAC: RRR, no murmur,no extra heart sounds, no edema GI: abdomen soft, normal BS, no masses, no tenderness, no hepatomegaly, no splenomegaly PSYCHIATRIC: the patient is alert & oriented to person, affect & behavior appropriate  LABS/RADIOLOGY: 6/14 TSH 0.295, free T4-1 0.39, WBC 12.4 otherwise CBC normal, glucose 131, BUN 25, albumin 3.1 otherwise CMP normal, fasting lipid panel normal 12/13 WBC 12.5, hemoglobin 11.4, MCV 90.3, platelets 360, albumin 2.4, total protein 5.5 otherwise liver profile normal, hemoglobin A1c 6.8, fasting lipid panel normal, vitamin D level 39  ASSESSMENT/PLAN:  protein calorie malnutrition-continue supplements. dementia-advanced. Continue comfort measures. constipation-adequately controlled; continue Miralax and Senna-S  CPT CODE: [id number]   Ella BodoMonina Vargas - NP [facility] [phone]

## 2013-10-05 ENCOUNTER — Encounter: Payer: Self-pay | Admitting: *Deleted

## 2013-10-16 ENCOUNTER — Non-Acute Institutional Stay (SKILLED_NURSING_FACILITY): Payer: Medicare Other | Admitting: Adult Health

## 2013-10-16 ENCOUNTER — Encounter: Payer: Self-pay | Admitting: Adult Health

## 2013-10-16 DIAGNOSIS — K5900 Constipation, unspecified: Secondary | ICD-10-CM

## 2013-10-16 DIAGNOSIS — F0390 Unspecified dementia without behavioral disturbance: Secondary | ICD-10-CM

## 2013-10-16 DIAGNOSIS — E43 Unspecified severe protein-calorie malnutrition: Secondary | ICD-10-CM

## 2013-10-16 NOTE — Progress Notes (Signed)
Patient ID: Denise LauthJeanne D Lynn, female   DOB: [birth year], 80 y.o.   MRN: [id number]          PROGRESS NOTE  DATE: 10/16/13  FACILITY: [facility]  LEVEL OF CARE: SNF  Routine Visit  CHIEF COMPLAINT:  Manage dementia, protein calorie malnutrition and constipation  HISTORY OF PRESENT ILLNESS:  REASSESSMENT OF ONGOING PROBLEMS:  DEMENTIA: The dementia remaines stable and continues to function adequately in the current living environment with supervision.  The patient has had little changes in behavior. Patient is a poor historian. She is on hospice.  CONSTIPATION: The constipation remains stable. No complications from the medications presently being used. Staff  deny ongoing constipation, abdominal pain, nausea or vomiting.   PAST MEDICAL HISTORY : Reviewed.  No changes.  [facility]  REVIEW OF SYSTEMS: Unobtainable due to dementia  PHYSICAL EXAMINATION  GENERAL: no acute distress, thin body habitus NECK: supple, trachea midline, no neck masses, no thyroid tenderness, no thyromegaly RESPIRATORY: breathing is even & unlabored, BS CTAB CARDIAC: RRR, no murmur,no extra heart sounds, no edema GI: abdomen soft, normal BS, no masses, no tenderness, no hepatomegaly, no splenomegaly EXTREMITIES: able to move all 4 extremities; generalized weakness on BLE; uses wheelchair PSYCHIATRIC: the patient is alert & oriented to person, affect & behavior appropriate  LABS/RADIOLOGY: 6/14 TSH 0.295, free T4-1 0.39, WBC 12.4 otherwise CBC normal, glucose 131, BUN 25, albumin 3.1 otherwise CMP normal, fasting lipid panel normal 12/13 WBC 12.5, hemoglobin 11.4, MCV 90.3, platelets 360, albumin 2.4, total protein 5.5 otherwise liver profile normal, hemoglobin A1c 6.8, fasting lipid panel normal, vitamin D level 39  ASSESSMENT/PLAN:  protein calorie malnutrition-continue supplements. dementia-advanced. Continue comfort measures. constipation-adequately controlled; continue  Miralax and Senna   CPT CODE: [id number]   Ella BodoMonina Vargas - NP [facility] [phone]

## 2013-11-01 ENCOUNTER — Encounter: Payer: Self-pay | Admitting: *Deleted

## 2013-11-08 ENCOUNTER — Non-Acute Institutional Stay (SKILLED_NURSING_FACILITY): Payer: Medicare Other | Admitting: Internal Medicine

## 2013-11-08 DIAGNOSIS — K5900 Constipation, unspecified: Secondary | ICD-10-CM

## 2013-11-08 DIAGNOSIS — F0390 Unspecified dementia without behavioral disturbance: Secondary | ICD-10-CM

## 2013-11-08 DIAGNOSIS — D638 Anemia in other chronic diseases classified elsewhere: Secondary | ICD-10-CM

## 2013-11-08 DIAGNOSIS — E43 Unspecified severe protein-calorie malnutrition: Secondary | ICD-10-CM

## 2013-11-08 NOTE — Progress Notes (Signed)
         PROGRESS NOTE  DATE: 11-08-13  FACILITY: [facility]  LEVEL OF CARE: SNF  Routine Visit  CHIEF COMPLAINT:  Manage dementia, protein calorie malnutrition and constipation  HISTORY OF PRESENT ILLNESS:  REASSESSMENT OF ONGOING PROBLEMS:  DEMENTIA: The dementia remaines stable and continues to function adequately in the current living environment with supervision.  The patient has had little changes in behavior. No complications noted from the medications presently being used.  Patient is a poor historian.  CONSTIPATION: The constipation remains stable. No complications from the medications presently being used. Staff  deny ongoing constipation, abdominal pain, nausea or vomiting.  protein calorie malnutrition-patient is on multiple dietary supplements. On 05/05/12 pre-albumin was 6.5. 12/13 albumin level 2.4.  PAST MEDICAL HISTORY : Reviewed.  No changes.  CURRENT MEDICATIONS: Reviewed per [facility]  REVIEW OF SYSTEMS: Unobtainable due to dementia  PHYSICAL EXAMINATION  VS: See vital sign section  GENERAL: no acute distress, thin body habitus EYES: Normal sclerae, normal conjunctivae, or discharge NECK: supple, trachea midline, no neck masses, no thyroid tenderness, no thyromegaly LYMPHATICS: No cervical lymphadenopathy, no supraclavicular lymphadenopathy RESPIRATORY: breathing is even & unlabored, BS CTAB CARDIAC: RRR, no murmur,no extra heart sounds, no edema GI: abdomen soft, normal BS, no masses, no tenderness, no hepatomegaly, no splenomegaly PSYCHIATRIC: the patient is alert & disoriented, affect & behavior appropriate  LABS/RADIOLOGY:  6/14 TSH 0.295, free T4-1 0.39, WBC 12.4 otherwise CBC normal, glucose 131, BUN 25, albumin 3.1 otherwise CMP normal, fasting lipid panel normal  12/13 WBC 12.5, hemoglobin 11.4, MCV 90.3, platelets 360, albumin 2.4, total protein 5.5 otherwise liver profile normal, hemoglobin A1c 6.8, fasting lipid panel normal, vitamin D level  39  ASSESSMENT/PLAN:  protein calorie malnutrition-continue supplements. dementia-advanced. constipation-adequately controlled. anemia of chronic disease-check hemoglobin level. Check CBC and CMP  CPT CODE: 74259  Newton Pigg. Kerry Dory, MD [facility] [phone]

## 2013-12-11 ENCOUNTER — Non-Acute Institutional Stay (SKILLED_NURSING_FACILITY): Payer: Medicare Other | Admitting: Adult Health

## 2013-12-11 ENCOUNTER — Encounter: Payer: Self-pay | Admitting: Adult Health

## 2013-12-11 DIAGNOSIS — F0390 Unspecified dementia without behavioral disturbance: Secondary | ICD-10-CM

## 2013-12-11 DIAGNOSIS — K5900 Constipation, unspecified: Secondary | ICD-10-CM

## 2013-12-11 DIAGNOSIS — E43 Unspecified severe protein-calorie malnutrition: Secondary | ICD-10-CM

## 2013-12-11 NOTE — Progress Notes (Signed)
Patient ID: Denise LauthJeanne D Lynn, female   DOB: 24-Nov-1918, 80 y.o.   MRN: [id number]         PROGRESS NOTE  DATE:   12/11/13 FACILITY: [facility]  LEVEL OF CARE: SNF  Routine Visit  CHIEF COMPLAINT:  Manage dementia, protein calorie malnutrition and constipation  HISTORY OF PRESENT ILLNESS:  REASSESSMENT OF ONGOING PROBLEMS:  DEMENTIA: The dementia remaines stable and continues to function adequately in the current living environment with supervision.  The patient has had little changes in behavior. No complications noted from the medications presently being used.  Patient is a poor historian.  CONSTIPATION: The constipation remains stable. No complications from the medications presently being used. Staff  deny ongoing constipation, abdominal pain, nausea or vomiting.   PAST MEDICAL HISTORY : Reviewed.  No changes.  [facility]  REVIEW OF SYSTEMS: Unobtainable due to dementia  PHYSICAL EXAMINATION  GENERAL: no acute distress, thin body habitus NECK: supple, trachea midline, no neck masses, no thyroid tenderness, no thyromegaly LYMPHATICS: No cervical lymphadenopathy, no supraclavicular lymphadenopathy RESPIRATORY: breathing is even & unlabored, BS CTAB CARDIAC: RRR, no murmur,no extra heart sounds, no edema GI: abdomen soft, normal BS, no masses, no tenderness, no hepatomegaly, no splenomegaly EXTREMITIES:  Able to wheel self while on wheelchair PSYCHIATRIC: the patient is alert & disoriented, affect & behavior appropriate  LABS/RADIOLOGY:  6/14 TSH 0.295, free T4-1 0.39, WBC 12.4 otherwise CBC normal, glucose 131, BUN 25, albumin 3.1 otherwise CMP normal, fasting lipid panel normal  12/13 WBC 12.5, hemoglobin 11.4, MCV 90.3, platelets 360, albumin 2.4, total protein 5.5 otherwise liver profile normal, hemoglobin A1c 6.8, fasting lipid panel normal, vitamin D level 39  ASSESSMENT/PLAN:  protein calorie malnutrition-continue  supplements. dementia-advanced. constipation-adequately controlled; continue MiraLax and Senokot  CPT CODE: [id number]  Ella BodoMonina Vargas - NP [facility] [phone]

## 2013-12-25 ENCOUNTER — Other Ambulatory Visit: Payer: Self-pay

## 2013-12-25 LAB — CBC WITH DIFFERENTIAL/PLATELET
BASOS ABS: 0.4 10*3/uL — AB (ref 0.0–0.1)
Basophil %: 2.9 %
EOS PCT: 0.4 %
Eosinophil #: 0.1 10*3/uL (ref 0.0–0.7)
HCT: 41 % (ref 35.0–47.0)
HGB: 13.5 g/dL (ref 12.0–16.0)
Lymphocyte #: 0.5 10*3/uL — ABNORMAL LOW (ref 1.0–3.6)
Lymphocyte %: 3.2 %
MCH: 32.2 pg (ref 26.0–34.0)
MCHC: 32.9 g/dL (ref 32.0–36.0)
MCV: 98 fL (ref 80–100)
Monocyte #: 0.3 x10 3/mm (ref 0.2–0.9)
Monocyte %: 2 %
NEUTROS ABS: 13.4 10*3/uL — AB (ref 1.4–6.5)
NEUTROS PCT: 91.5 %
Platelet: 256 10*3/uL (ref 150–440)
RBC: 4.19 10*6/uL (ref 3.80–5.20)
RDW: 13.2 % (ref 11.5–14.5)
WBC: 14.7 10*3/uL — AB (ref 3.6–11.0)

## 2013-12-25 LAB — BASIC METABOLIC PANEL
Anion Gap: 12 (ref 7–16)
BUN: 33 mg/dL — AB (ref 7–18)
CALCIUM: 8.8 mg/dL (ref 8.5–10.1)
CHLORIDE: 104 mmol/L (ref 98–107)
CO2: 24 mmol/L (ref 21–32)
Creatinine: 1.05 mg/dL (ref 0.60–1.30)
GFR CALC AF AMER: 53 — AB
GFR CALC NON AF AMER: 46 — AB
GLUCOSE: 228 mg/dL — AB (ref 65–99)
Osmolality: 294 (ref 275–301)
Potassium: 3.9 mmol/L (ref 3.5–5.1)
Sodium: 140 mmol/L (ref 136–145)

## 2013-12-26 ENCOUNTER — Non-Acute Institutional Stay (SKILLED_NURSING_FACILITY): Payer: Medicare Other | Admitting: Adult Health

## 2013-12-26 ENCOUNTER — Encounter: Payer: Self-pay | Admitting: Adult Health

## 2013-12-26 DIAGNOSIS — I10 Essential (primary) hypertension: Secondary | ICD-10-CM | POA: Insufficient documentation

## 2013-12-26 DIAGNOSIS — N390 Urinary tract infection, site not specified: Secondary | ICD-10-CM | POA: Insufficient documentation

## 2013-12-26 NOTE — Progress Notes (Signed)
Patient ID: Myrtice LauthJeanne D Sozio, female   DOB: [birth year], 80 y.o.   MRN: [id number]        PROGRESS NOTE  DATE:   12/26/13  FACILITY: [facility]  LEVEL OF CARE: SNF  Acute Visit  CHIEF COMPLAINT:  Manage UTI/Lekocytosis  HISTORY OF PRESENT ILLNESS: This is a 80 year old female who was observed to be shivering and has fever. No cough noted. Chest x-ray is (-) for pneumonia. Urinalysis shows large leukocytes with small amount of blood. It was also noted that her wbc is 14.7 - elevated. BP 212/112 and P 125.   PAST MEDICAL HISTORY : Reviewed.  No changes.  [facility]  REVIEW OF SYSTEMS: Unobtainable due to dementia  PHYSICAL EXAMINATION  GENERAL: no acute distress, thin body habitus NECK: supple, trachea midline, no neck masses, no thyroid tenderness, no thyromegaly RESPIRATORY: breathing is even & unlabored, BS CTAB CARDIAC: RRR, no murmur,no extra heart sounds, no edema GI: abdomen soft, normal BS, no masses, no tenderness, no hepatomegaly, no splenomegaly EXTREMITIES:  Able to move all 4 extremities PSYCHIATRIC: the patient is alert & disoriented, affect & behavior appropriate  LABS/RADIOLOGY: 12/25/13  WBC 14.7 hemoglobin 13.5 hematocrit 41.0 glucose 228 BUN 33 creatinine 1.05 sodium 140 potassium 3.9 6/14 TSH 0.295, free T4-1 0.39, WBC 12.4 otherwise CBC normal, glucose 131, BUN 25, albumin 3.1 otherwise CMP normal, fasting lipid panel normal 12/13 WBC 12.5, hemoglobin 11.4, MCV 90.3, platelets 360, albumin 2.4, total protein 5.5 otherwise liver profile normal, hemoglobin A1c 6.8, fasting lipid panel normal, vitamin D level 39  ASSESSMENT/PLAN:  Hypertension/Tachycardia - Lopressor 25 mg PO X 1; BP/HR Q shift X1 week UTI - start Rocephin 1 gm IM Q D X 7 days   CPT CODE: [id number]  Ella BodoMonina Vargas - NP [facility] [phone]

## 2014-01-01 ENCOUNTER — Encounter: Payer: Self-pay | Admitting: Adult Health

## 2014-01-01 ENCOUNTER — Non-Acute Institutional Stay (SKILLED_NURSING_FACILITY): Payer: Medicare Other | Admitting: Adult Health

## 2014-01-01 DIAGNOSIS — N390 Urinary tract infection, site not specified: Secondary | ICD-10-CM

## 2014-01-01 DIAGNOSIS — F0390 Unspecified dementia without behavioral disturbance: Secondary | ICD-10-CM

## 2014-01-01 DIAGNOSIS — K5900 Constipation, unspecified: Secondary | ICD-10-CM

## 2014-01-01 DIAGNOSIS — E43 Unspecified severe protein-calorie malnutrition: Secondary | ICD-10-CM

## 2014-01-01 NOTE — Progress Notes (Signed)
Patient ID: Denise Lynn, female   DOB: 1918-12-03, 80 y.o.   MRN: [id number]         PROGRESS NOTE  DATE:     01/01/14  FACILITY:   [facility]  LEVEL OF CARE:  SNF  Routine Visit  CHIEF COMPLAINT:  Manage dementia, protein calorie malnutrition, UTI and constipation  HISTORY OF PRESENT ILLNESS:  REASSESSMENT OF ONGOING PROBLEMS:  UTI: The UTI remains stable.  The patient denies ongoing suprapubic pain, flank pain, dysuria, urinary frequency, urinary hesitancy or hematuria.  No complications reported from the current antibiotic being used.  DEMENTIA: The dementia remaines stable and continues to function adequately in the current living environment with supervision.  The patient has had little changes in behavior. No complications noted from the medications presently being used.  Patient is a poor historian.  CONSTIPATION: The constipation remains stable. No complications from the medications presently being used. Staff  deny ongoing constipation, abdominal pain, nausea or vomiting.   PAST MEDICAL HISTORY : Reviewed.  No changes.  CURRENT MEDICATIONS: [facility]  REVIEW OF SYSTEMS: Unobtainable due to dementia  PHYSICAL EXAMINATION  GENERAL: no acute distress, thin body habitus NECK: supple, trachea midline, no neck masses, no thyroid tenderness, no thyromegaly RESPIRATORY: breathing is even & unlabored, BS CTAB CARDIAC: RRR, no murmur,no extra heart sounds, no edema GI: abdomen soft, normal BS, no masses, no tenderness, no hepatomegaly, no splenomegaly EXTREMITIES:  Able to wheel self while on wheelchair PSYCHIATRIC: the patient is alert & disoriented, affect & behavior appropriate  LABS/RADIOLOGY: 12/25/13  WBC 14.7 hemoglobin 13.5 hematocrit 41.0 glucose 228 BUN 33 creatinine 1.05 sodium 140 potassium 3.9 6/14 TSH 0.295, free T4-1 0.39, WBC 12.4 otherwise CBC normal, glucose 131, BUN 25, albumin 3.1 otherwise CMP normal, fasting lipid panel normal 12/13 WBC 12.5,  hemoglobin 11.4, MCV 90.3, platelets 360, albumin 2.4, total protein 5.5 otherwise liver profile normal, hemoglobin A1c 6.8, fasting lipid panel normal, vitamin D level 39  ASSESSMENT/PLAN:  UTI  -  continue Rocephin protein calorie malnutrition  -  continue supplements; for  Palliative consult Dementia  -  advanced. constipation-adequately controlled; continue MiraLax and Senokot  CPT CODE: [id number]  Ella BodoMonina Vargas - NP [facility] [phone]

## 2014-01-24 IMAGING — CT CT HEAD W/O CM
1 of 2 series · 13 of 30 positions shown, 17 images · non-contrast
Comparison: None.

CLINICAL DATA: Altered mental status.

CT HEAD WITHOUT CONTRAST
TECHNIQUE: Contiguous axial images were obtained from the base of
the skull through the vertex without contrast.

[Series 2: brain · axial · 0.47mm/px · z∈[+131,+263]mm · 13 of 32 slices shown, 17 images]
[im 3/32  brain]
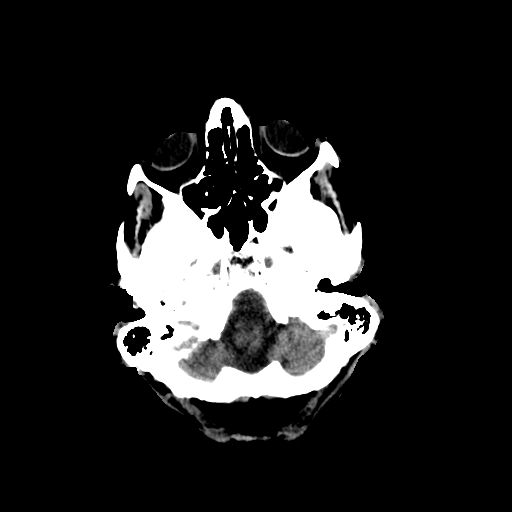
[im 3/32  bone]
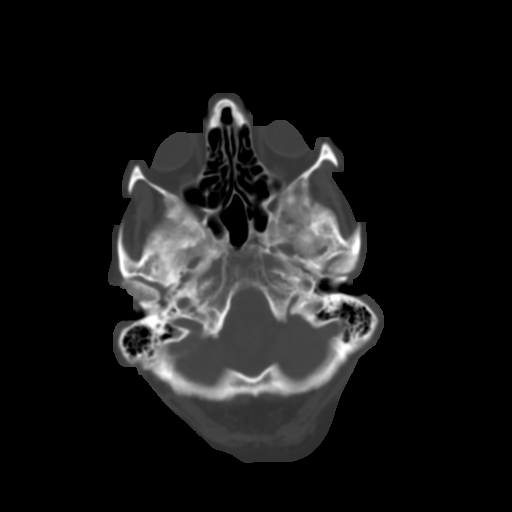
[im 5/32  brain]
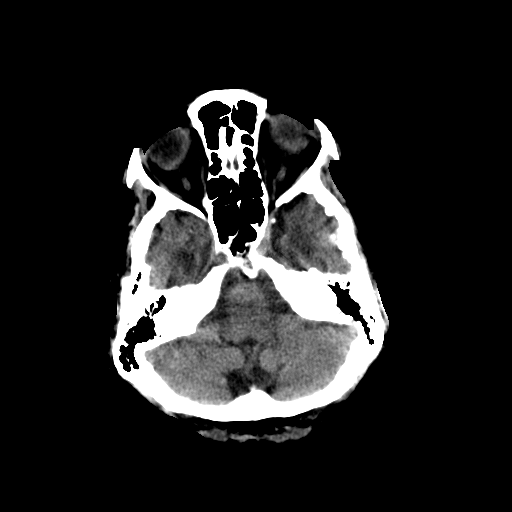
[im 7/32  brain]
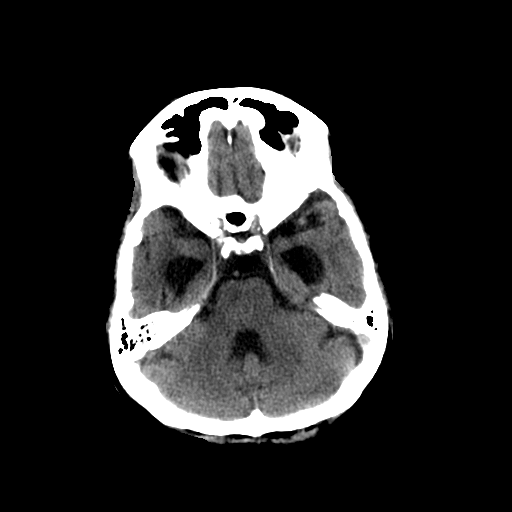
[im 9/32  brain]
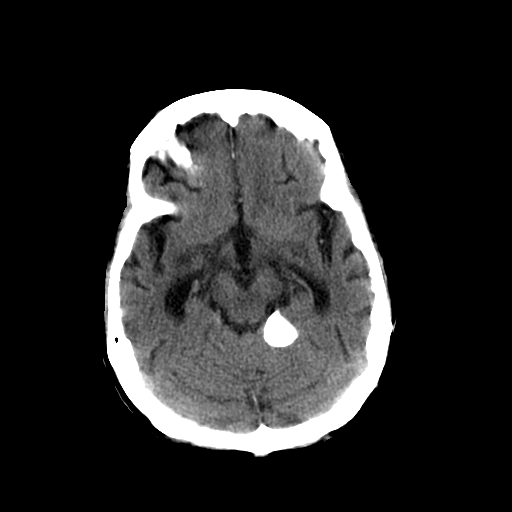
[im 12/32  brain]
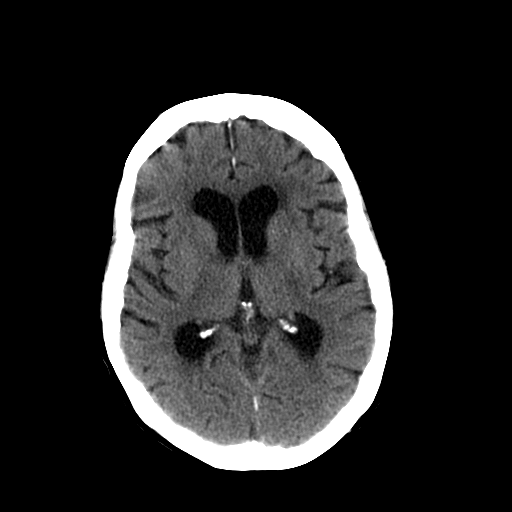
[im 12/32  bone]
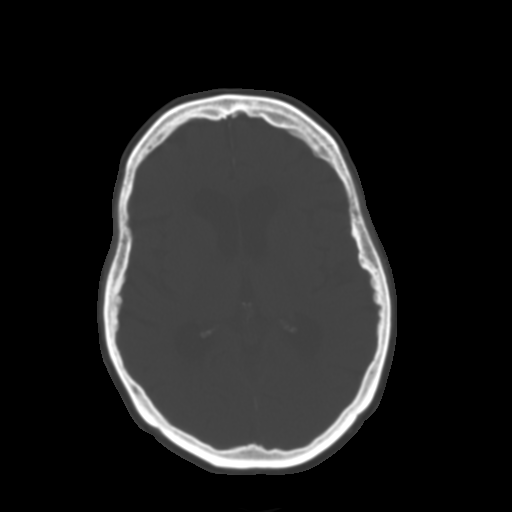
[im 14/32  brain]
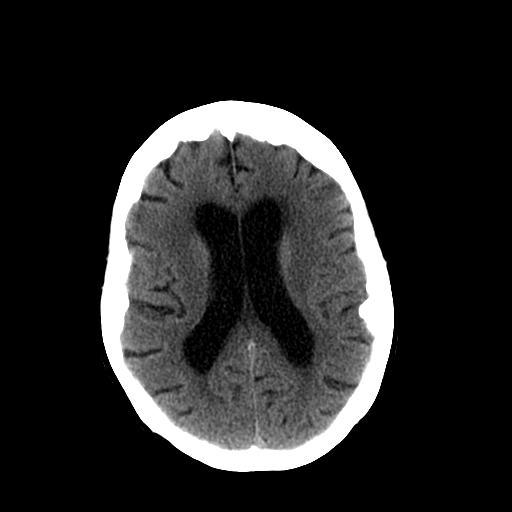
[im 16/32  brain]
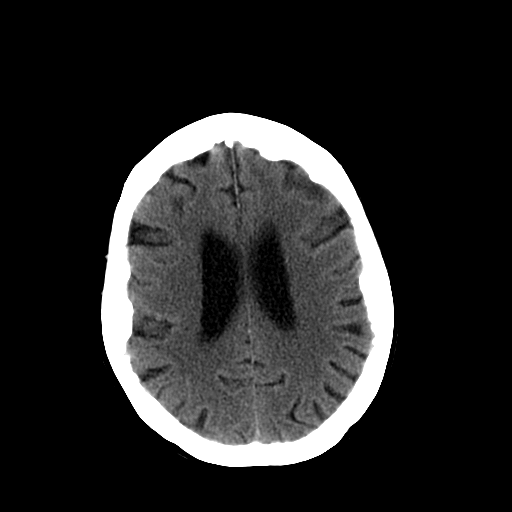
[im 18/32  brain]
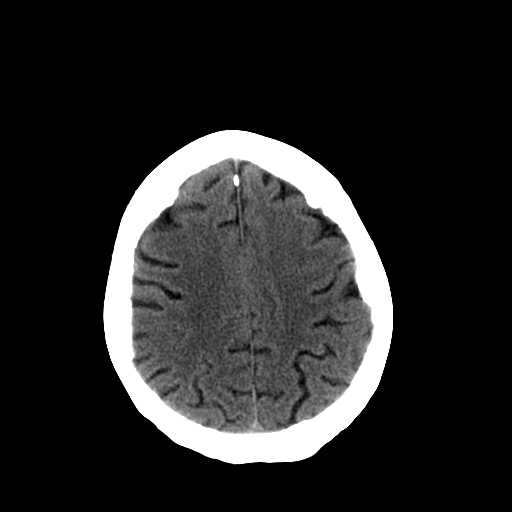
[im 20/32  brain]
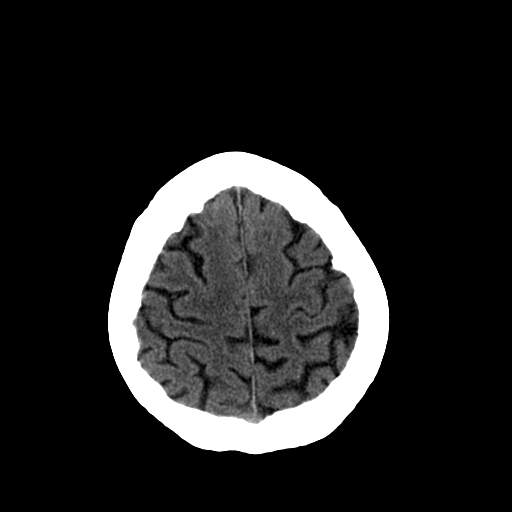
[im 20/32  bone]
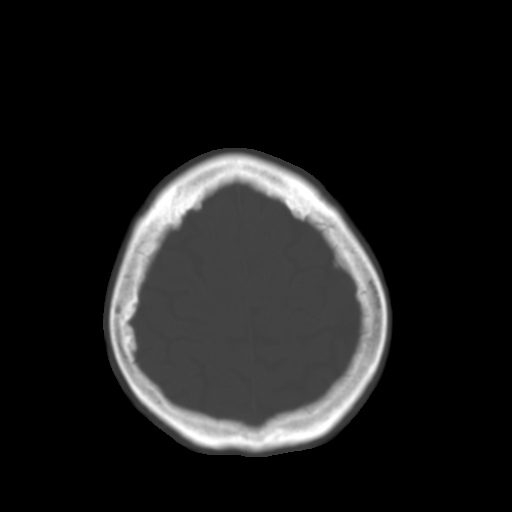
[im 23/32  brain]
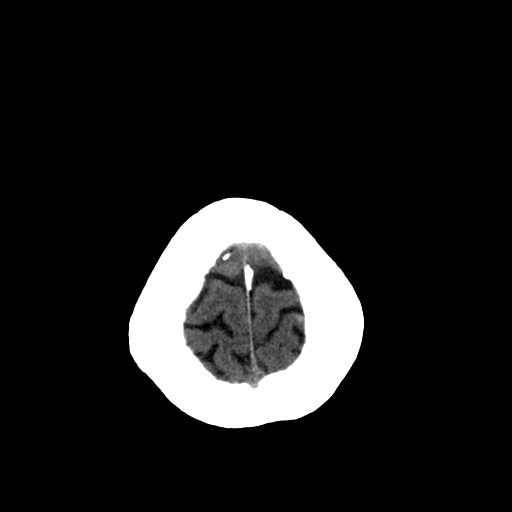
[im 25/32  brain]
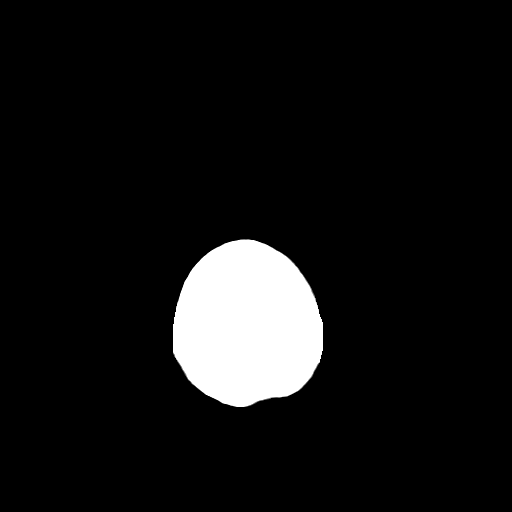
[im 27/32  brain]
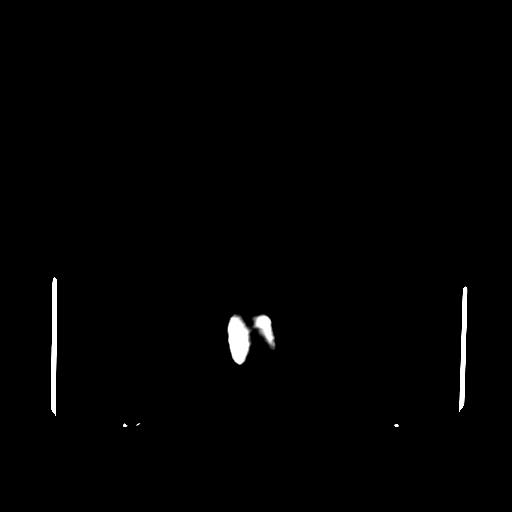
[im 29/32  brain]
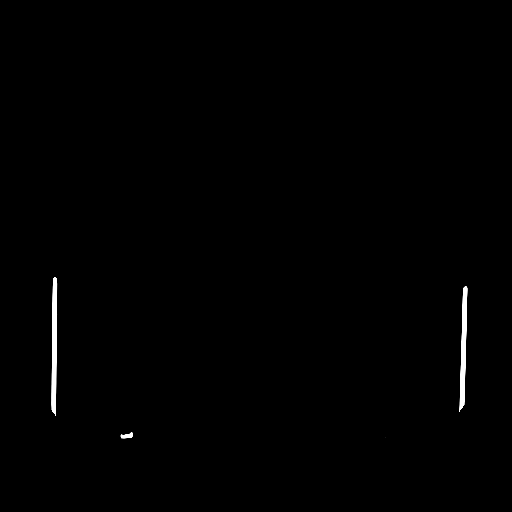
[im 29/32  bone]
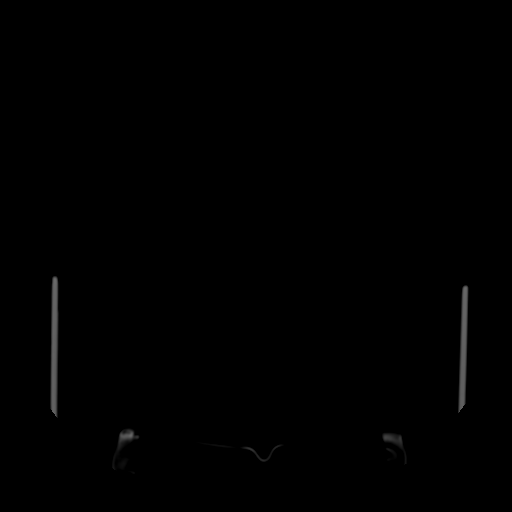

[13 of 30 positions shown; findings below may reference images not displayed]

FINDINGS: 1.9 x 1.5 cm rounded, densely calcified mass along the
tentorium on the left, measured on image #9.  No associated mass
effect or edema.  Diffusely enlarged ventricles and subarachnoid
spaces.  Patchy white matter low density in both cerebral
hemispheres.  Small amount of bilateral basal ganglia
calcification.  No intracranial hemorrhage or CT evidence of acute
infarction.  Bilateral hyperostosis frontalis.  The included
portions of the paranasal sinuses are normally pneumatized.
IMPRESSION: 1.  No acute abnormality.
2.  1.9 cm tentorial meningioma on the left.
3.  Atrophy and chronic small vessel white matter ischemic changes.

## 2014-01-25 IMAGING — CR DG CHEST 1V PORT
1 series · 1 of 1 positions shown · non-contrast
Comparison: None.

CLINICAL DATA: Altered mental status

PORTABLE CHEST - 1 VIEW

[AP]
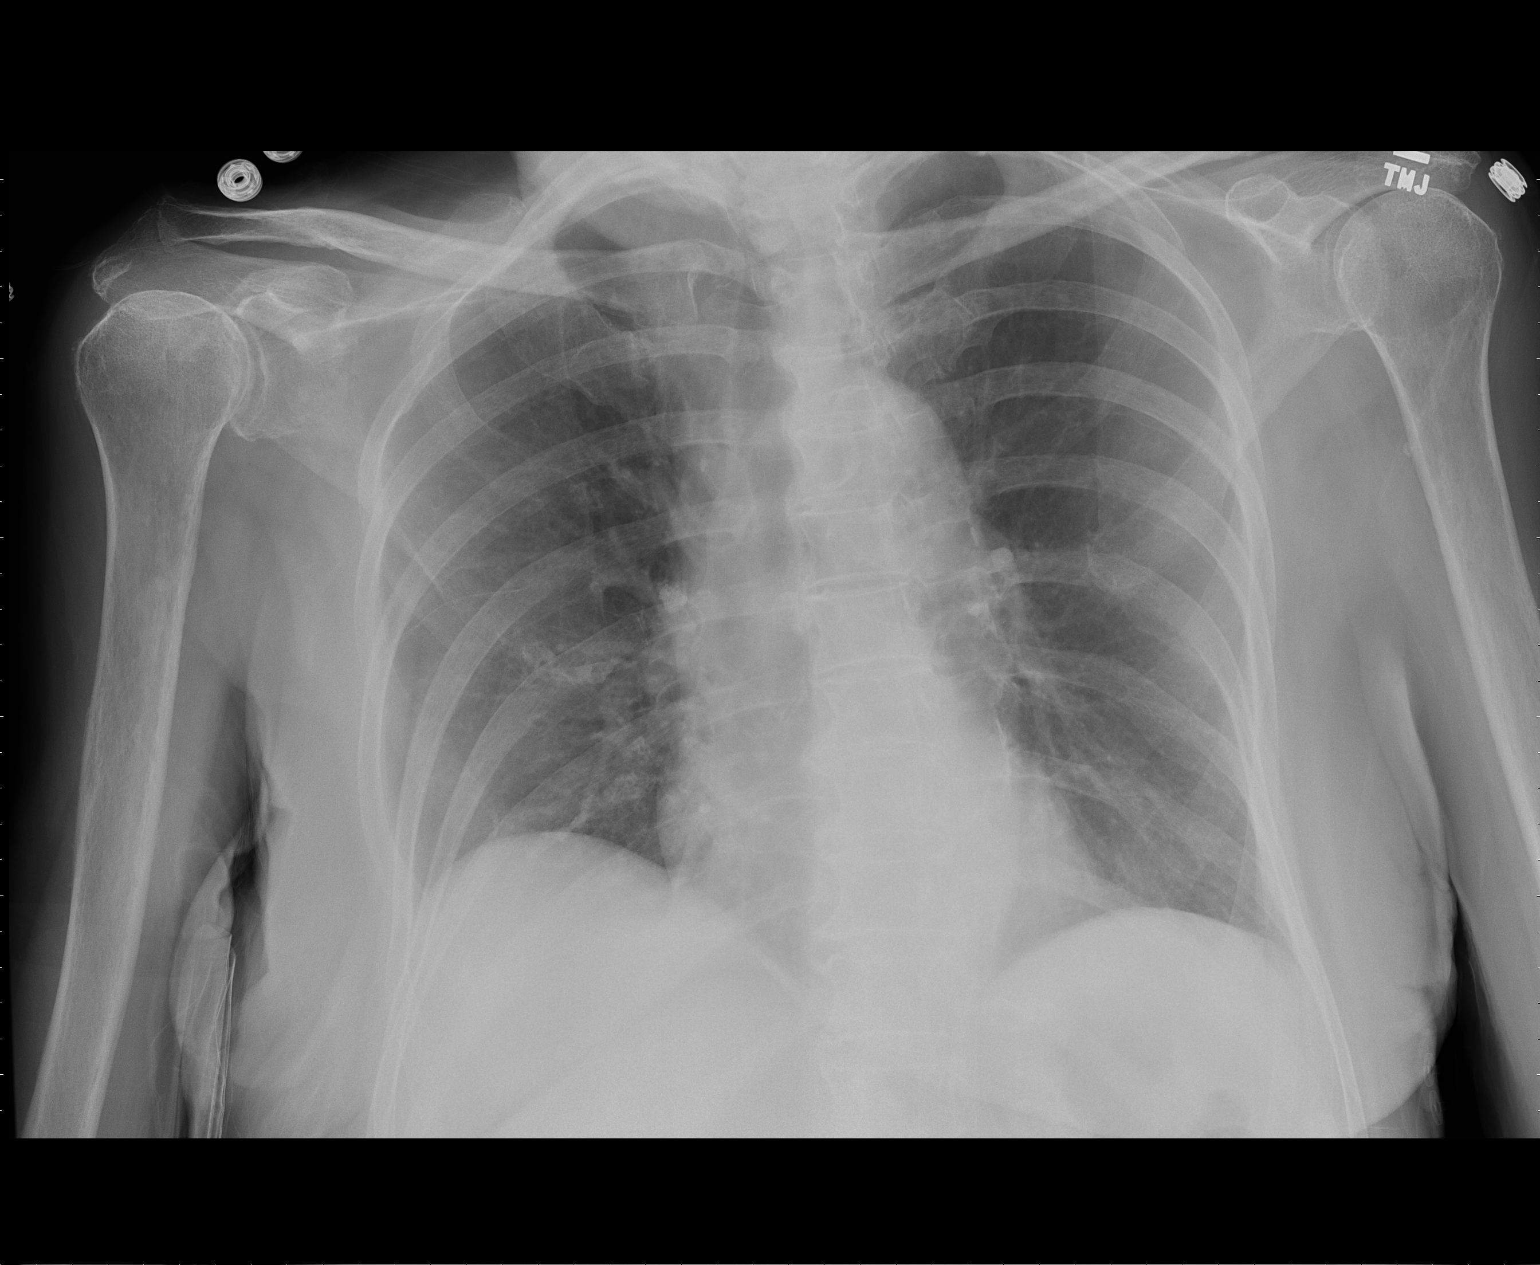

[1 of 1 positions shown; findings below may reference images not displayed]

FINDINGS: Cardiomediastinal silhouette is unremarkable.  Mild
elevation of the right hemidiaphragm.  Mild right basilar
atelectasis.  No acute infiltrate or pulmonary edema.  Mild
degenerative changes thoracic spine.
IMPRESSION: No active disease.  Mild elevation of the right hemidiaphragm with
right basilar atelectasis.

## 2014-01-26 IMAGING — CR DG ABD PORTABLE 2V
2 series · 2 of 2 positions shown · non-contrast
Comparison: None.

CLINICAL DATA: Abdominal pain

PORTABLE ABDOMEN - 2 VIEW

[AP]
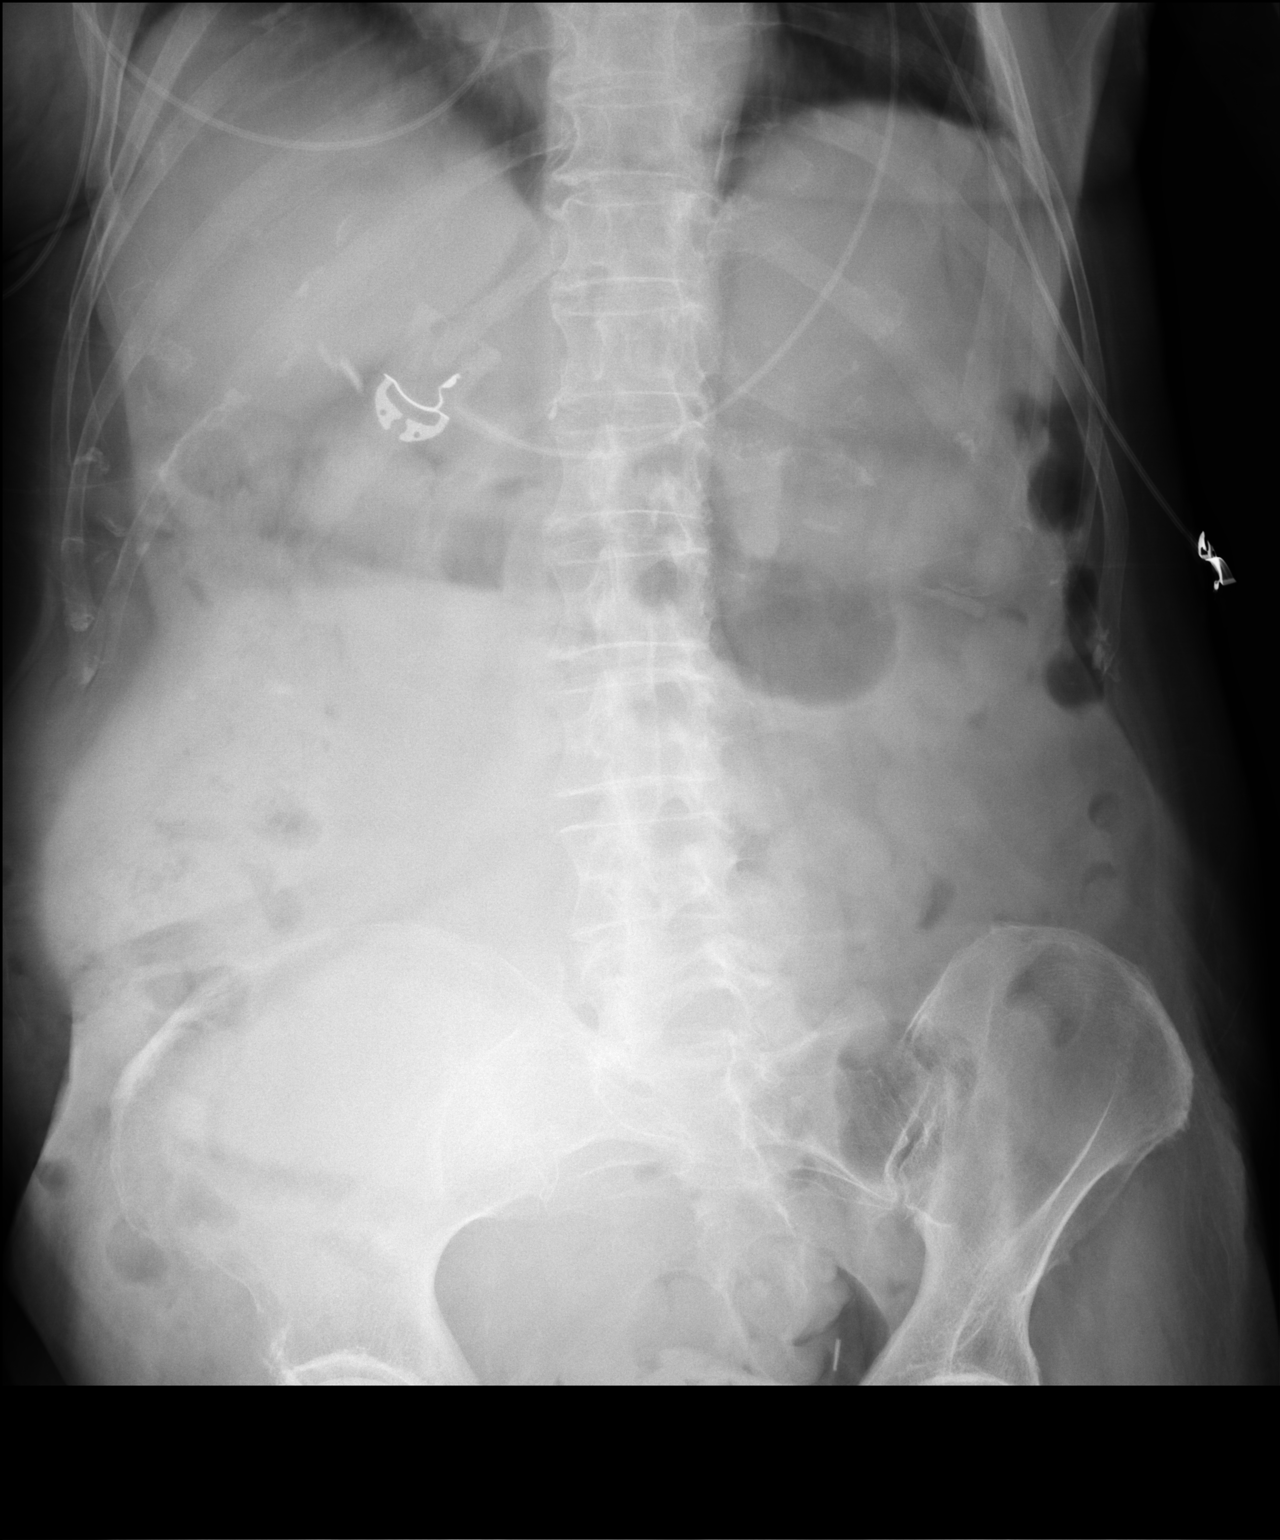

[ap lld]
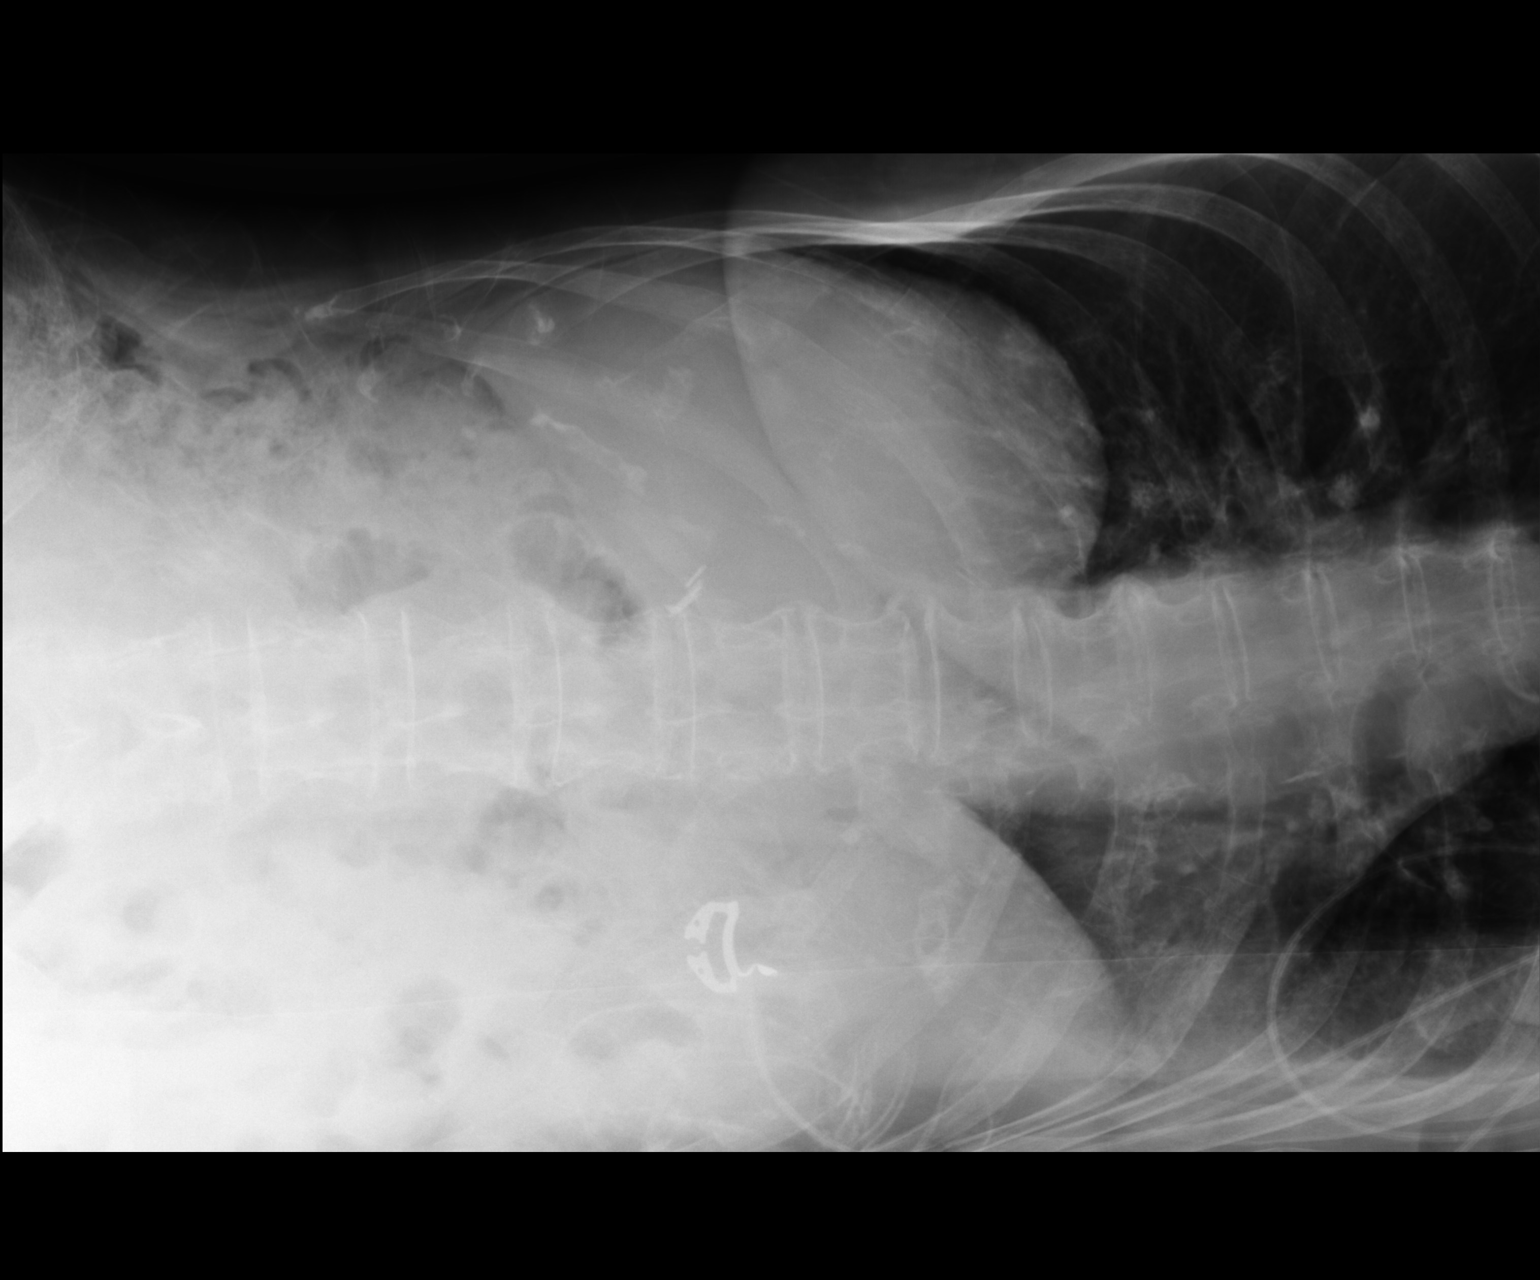

[2 of 2 positions shown; findings below may reference images not displayed]

FINDINGS: Scattered air and stool throughout the bowel.  No
definite free air on the decubitus view.  Lung bases clear.
Atherosclerotic calcifications noted.  Diffuse degenerative changes
of the spine and osteopenia.
IMPRESSION: Negative for obstruction or free air.

## 2014-02-07 ENCOUNTER — Encounter: Payer: Self-pay | Admitting: Adult Health

## 2014-02-07 ENCOUNTER — Non-Acute Institutional Stay (SKILLED_NURSING_FACILITY): Payer: Medicare Other | Admitting: Adult Health

## 2014-02-07 DIAGNOSIS — K5900 Constipation, unspecified: Secondary | ICD-10-CM

## 2014-02-07 DIAGNOSIS — E43 Unspecified severe protein-calorie malnutrition: Secondary | ICD-10-CM

## 2014-02-07 DIAGNOSIS — F0390 Unspecified dementia without behavioral disturbance: Secondary | ICD-10-CM

## 2014-02-07 NOTE — Progress Notes (Signed)
Patient ID: Denise Lynn, female   DOB: 02/18/1919, 80 y.o.   MRN: [id number]         PROGRESS NOTE  DATE:        02/07/14   FACILITY:   [facility]  LEVEL OF CARE:  SNF  Routine Visit  CHIEF COMPLAINT:  Manage dementia, protein calorie malnutrition and constipation  HISTORY OF PRESENT ILLNESS:  REASSESSMENT OF ONGOING PROBLEMS:  DEMENTIA: The dementia remaines stable and continues to function adequately in the current living environment with supervision.  The patient has had little changes in behavior. No medications presently being used.  Patient is a poor historian.  CONSTIPATION: The constipation remains stable. No complications from the medications presently being used. Staff  deny ongoing constipation, abdominal pain, nausea or vomiting.   PAST MEDICAL HISTORY : Reviewed.  No changes.  CURRENT MEDICATIONS: Reviewed per [facility]  REVIEW OF SYSTEMS: Unobtainable due to dementia  PHYSICAL EXAMINATION  GENERAL: no acute distress, thin body habitus EYES:  Lids open and close normally, no blepharitis, ectropion nor entropion NECK: supple, trachea midline, no neck masses, no thyroid tenderness, no thyromegaly RESPIRATORY: breathing is even & unlabored, BS CTAB CARDIAC: RRR, no murmur,no extra heart sounds, no edema GI: abdomen soft, normal BS, no masses, no tenderness, no hepatomegaly, no splenomegaly EXTREMITIES:  Able to wheel self while on wheelchair PSYCHIATRIC: the patient is alert & disoriented, affect & behavior appropriate  LABS/RADIOLOGY: 12/25/13  WBC 14.7 hemoglobin 13.5 hematocrit 41.0 glucose 228 BUN 33 creatinine 1.05 sodium 140 potassium 3.9 6/14 TSH 0.295, free T4-1 0.39, WBC 12.4 otherwise CBC normal, glucose 131, BUN 25, albumin 3.1 otherwise CMP normal, fasting lipid panel normal 12/13 WBC 12.5, hemoglobin 11.4, MCV 90.3, platelets 360, albumin 2.4, total protein 5.5 otherwise liver profile normal, hemoglobin A1c 6.8, fasting lipid panel normal, vitamin D level  39  ASSESSMENT/PLAN:  protein calorie malnutrition  -  continue supplements; has Palliative consult Dementia  -  advanced. constipation-adequately controlled; continue Senokot-S  CPT CODE: 40981  Ella Bodo - NP [facility] [phone]

## 2014-06-03 ENCOUNTER — Non-Acute Institutional Stay (SKILLED_NURSING_FACILITY): Payer: Medicare Other | Admitting: Adult Health

## 2014-06-03 ENCOUNTER — Encounter: Payer: Self-pay | Admitting: Adult Health

## 2014-06-03 DIAGNOSIS — E781 Pure hyperglyceridemia: Secondary | ICD-10-CM | POA: Insufficient documentation

## 2014-06-03 DIAGNOSIS — E43 Unspecified severe protein-calorie malnutrition: Secondary | ICD-10-CM

## 2014-06-03 DIAGNOSIS — K5900 Constipation, unspecified: Secondary | ICD-10-CM

## 2014-06-03 DIAGNOSIS — F0390 Unspecified dementia without behavioral disturbance: Secondary | ICD-10-CM

## 2014-06-03 NOTE — Progress Notes (Signed)
Patient ID: Denise Lynn, female   DOB: Feb 04, 1919, 81 y.o.   MRN: [id number]   06/03/2014  Facility:  Nursing Home Location:  [facility] Room Number: 1005-1 LEVEL OF CARE:  SNF (31)  Routine Visit  Chief Complaint  Patient presents with  . Medical Management of Chronic Issues    Protein-calorie Malnutrition, Dementia and Constipation    HISTORY OF PRESENT ILLNESS:  REASSESSMENT OF ONGOING PROBLEMS:  DEMENTIA: The dementia remaines stable and continues to function adequately in the current living environment with supervision.  The patient has had little changes in behavior. No medication presently being used.  CONSTIPATION: The constipation remains stable. No complications from the medications presently being used. Patient denies ongoing constipation, abdominal pain, nausea or vomiting.  PROTEIN-CALORIE MALNUTRITION:  Latest albumin 3.3 and currently on protein supplement; gained 0.4 lbs from Nov - Dec 2015   PAST MEDICAL HISTORY:  Past Medical History  Diagnosis Date  . Hypercholesteremia   . Dementia     CURRENT MEDICATIONS: Reviewed per MAR/see medication list  Allergies  Allergen Reactions  . Demerol [Meperidine] Other (See Comments)    agitation  . Lodine [Etodolac] Other (See Comments)    Mouth blisters  . Tylenol [Acetaminophen] Other (See Comments)    dizziness     REVIEW OF SYSTEMS:  Unobtainable due to Dementia  PHYSICAL EXAMINATION  GENERAL: no acute distress, normal body habitus EYES: conjunctivae normal, sclerae normal, normal eye lids NECK: supple, trachea midline, no neck masses, no thyroid tenderness, no thyromegaly LYMPHATICS: no LAN in the neck, no supraclavicular LAN RESPIRATORY: breathing is even & unlabored, BS CTAB CARDIAC: RRR, no murmur,no extra heart sounds, no edema GI: abdomen soft, normal BS, no masses, no tenderness, no hepatomegaly, no splenomegaly EXTREMITIES: able to move all 4 extremities; uses  wheelchair and propels with her feet PSYCHIATRIC: the patient is alert to self; affect & behavior appropriate  LABS/RADIOLOGY: 05/09/14  WBC 7.5 hemoglobin 13.7 hematocrit 41.5 MCV 94.7 sodium 142 potassium 3.9 glucose 116 BUN 25 creatinine 0.7 calcium 8.9 total protein 6.2 albumin 3.3 total bilirubin 0.4 ALP 47 AST 13 ALT 16 cholesterol 186 HDL 33 and LDL 102 triglycerides 253 06/18/12  WBC 14.7 hemoglobin 13.5 hematocrit 41.0 glucose 228 BUN 33 creatinine 1.05 sodium 140 potassium 3.9 6/14 TSH 0.295, free T4-1 0.39, WBC 12.4 otherwise CBC normal, glucose 131, BUN 25, albumin 3.1 otherwise CMP normal, fasting lipid panel normal 12/13 WBC 12.5, hemoglobin 11.4, MCV 90.3, platelets 360, albumin 2.4, total protein 5.5 otherwise liver profile normal, hemoglobin A1c 6.8, fasting lipid panel normal, vitamin D level 39   ASSESSMENT/PLAN:  Protein calorie malnutrition, severe - continue protein supplementation Senile dementia - stable Hypertriglyceridemia - patient is 81 years old and family does not want to start patient on medication @ this time Constipation -  Continue senna + 1 tablet by mouth daily at bedtime  Goals of care:  Long-term care    Labs/test ordered:  none     CPT CODE: 78295    [facility], NP [facility] [phone]

## 2014-08-16 ENCOUNTER — Non-Acute Institutional Stay (SKILLED_NURSING_FACILITY): Payer: Medicare Other | Admitting: Adult Health

## 2014-08-16 DIAGNOSIS — E43 Unspecified severe protein-calorie malnutrition: Secondary | ICD-10-CM

## 2014-08-16 DIAGNOSIS — F0390 Unspecified dementia without behavioral disturbance: Secondary | ICD-10-CM

## 2014-08-16 DIAGNOSIS — K5900 Constipation, unspecified: Secondary | ICD-10-CM | POA: Diagnosis not present

## 2014-08-17 ENCOUNTER — Encounter: Payer: Self-pay | Admitting: Adult Health

## 2014-08-17 NOTE — Progress Notes (Signed)
Patient ID: Myrtice LauthJeanne D Ibe, female   DOB: November 11, 1918, 81 y.o.   MRN: [id number]   08/16/14  Facility:  Nursing Home Location:  [facility] Room Number: 1005-1 LEVEL OF CARE:  SNF (31)  Routine Visit  Chief Complaint  Patient presents with  . Medical Management of Chronic Issues    Protein calorie malnutrition, dementia and constipation    HISTORY OF PRESENT ILLNESS:  This is a 81 year old female who [facility] She has been stable for the past month. She has past medical history of hypercholesterolemia, dementia and constipation.  PAST MEDICAL HISTORY:  Past Medical History  Diagnosis Date  . Hypercholesteremia   . Dementia     CURRENT MEDICATIONS: Reviewed per MAR/see medication list  Allergies  Allergen Reactions  . Demerol [Meperidine] Other (See Comments)    agitation  . Lodine [Etodolac] Other (See Comments)    Mouth blisters  . Tylenol [Acetaminophen] Other (See Comments)    dizziness     REVIEW OF SYSTEMS:  Unobtainable due to Dementia  PHYSICAL EXAMINATION  GENERAL: no acute distress, normal body habitus NECK: supple, trachea midline, no neck masses, no thyroid tenderness, no thyromegaly LYMPHATICS: no LAN in the neck, no supraclavicular LAN RESPIRATORY: breathing is even & unlabored, BS CTAB CARDIAC: RRR, no murmur,no extra heart sounds, no edema GI: abdomen soft, normal BS, no masses, no tenderness, no hepatomegaly, no splenomegaly EXTREMITIES: able to move all 4 extremities; uses wheelchair and propels with her feet PSYCHIATRIC: the patient is alert to self; affect & behavior appropriate  LABS/RADIOLOGY: 05/09/14  WBC 7.5 hemoglobin 13.7 hematocrit 41.5 MCV 94.7 sodium 142 potassium 3.9 glucose 116 BUN 25 creatinine 0.7 calcium 8.9 total protein 6.2 albumin 3.3 total bilirubin 0.4 ALP 47 AST 13 ALT 16 cholesterol 186 HDL 33 and LDL 102 triglycerides 253 4/09/817/28/15  WBC 14.7 hemoglobin 13.5  hematocrit 41.0 glucose 228 BUN 33 creatinine 1.05 sodium 140 potassium 3.9 6/14 TSH 0.295, free T4-1 0.39, WBC 12.4 otherwise CBC normal, glucose 131, BUN 25, albumin 3.1 otherwise CMP normal, fasting lipid panel normal 12/13 WBC 12.5, hemoglobin 11.4, MCV 90.3, platelets 360, albumin 2.4, total protein 5.5 otherwise liver profile normal, hemoglobin A1c 6.8, fasting lipid panel normal,  vitamin D level 39   ASSESSMENT/PLAN:  Protein calorie malnutrition, severe - albumin 3.3 ;continue protein supplementation Senile dementia - stable Constipation -  Continue senna + 1 tablet by mouth daily at bedtime   Goals of care:  Long-term care  Labs/test ordered:  CBC and CMP      MEDINA-VARGAS,Yanissa Michalsky, NP [facility] [phone]

## 2014-08-19 LAB — HEPATIC FUNCTION PANEL
ALT: 16 U/L (ref 7–35)
AST: 15 U/L (ref 13–35)
Alkaline Phosphatase: 61 U/L (ref 25–125)
BILIRUBIN, TOTAL: 0.4 mg/dL

## 2014-08-19 LAB — CBC AND DIFFERENTIAL
HEMATOCRIT: 40 % (ref 36–46)
HEMOGLOBIN: 13.6 g/dL (ref 12.0–16.0)
Platelets: 261 10*3/uL (ref 150–399)
WBC: 7.3 10*3/mL

## 2014-08-19 LAB — BASIC METABOLIC PANEL
BUN: 17 mg/dL (ref 4–21)
CREATININE: 0.6 mg/dL (ref <=1.1)
Glucose: 114 mg/dL
POTASSIUM: 3.8 mmol/L (ref 3.4–5.3)
SODIUM: 142 mmol/L (ref 137–147)

## 2014-09-06 ENCOUNTER — Non-Acute Institutional Stay (SKILLED_NURSING_FACILITY): Payer: Medicare Other | Admitting: Internal Medicine

## 2014-09-06 DIAGNOSIS — E785 Hyperlipidemia, unspecified: Secondary | ICD-10-CM | POA: Diagnosis not present

## 2014-09-06 DIAGNOSIS — I10 Essential (primary) hypertension: Secondary | ICD-10-CM | POA: Diagnosis not present

## 2014-09-06 DIAGNOSIS — F0390 Unspecified dementia without behavioral disturbance: Secondary | ICD-10-CM

## 2014-09-06 DIAGNOSIS — K5900 Constipation, unspecified: Secondary | ICD-10-CM | POA: Diagnosis not present

## 2014-09-06 DIAGNOSIS — E440 Moderate protein-calorie malnutrition: Secondary | ICD-10-CM | POA: Diagnosis not present

## 2014-09-06 NOTE — Progress Notes (Signed)
Patient ID: Denise Lynn, female   DOB: 04-Feb-1919, 81 y.o.   MRN: [id number]    [facility]  Chief Complaint  Patient presents with  . Medical Management of Chronic Issues   Allergies  Allergen Reactions  . Demerol [Meperidine] Other (See Comments)    agitation  . Lodine [Etodolac] Other (See Comments)    Mouth blisters  . Tylenol [Acetaminophen] Other (See Comments)    dizziness   HPI 81 y/o female patient is seen today for routine visit. She has pmh of HTN, constipation, dementia and protein calorie malnutrition. She is seen in her room today. She denies any concerns this visit. No falls reported. No skin concerns. No new behavioral concerns. She participates with activities. No new concern from staff. Weight has been stable.  Review of Systems  Constitutional: Negative for fever, chills, diaphoresis.  HENT: Negative for congestion, hearing loss and sore throat.   Eyes: Negative for blurred vision, double vision and discharge.  Respiratory: Negative for cough, sputum production, shortness of breath and wheezing.   Cardiovascular: Negative for chest pain, palpitations, orthopnea and leg swelling.  Gastrointestinal: Negative for heartburn, nausea, vomiting, abdominal pain.  Genitourinary: Negative for dysuria Skin: Negative for itching and rash.  Neurological: Negative for dizziness  Past Medical History  Diagnosis Date  . Hypercholesteremia   . Dementia       Medication List       This list is accurate as of: 09/06/14  2:04 PM.  Always use your most recent med list.               multivitamin with minerals Tabs tablet  Take 1 tablet by mouth daily.     PROCEL Powd  Take by mouth. Administer 1 scoop by mouth twice daily for nutritional support (285 gm can)     sennosides-docusate sodium 8.6-50 MG tablet  Commonly known as:  SENOKOT-S  Take 1 tablet by mouth at bedtime. For constipation       Physical exam BP 119/68 mmHg   Pulse 75  Temp(Src) 97.5 F (36.4 C)  Resp 18  Wt 118 lb 12.8 oz (53.887 kg)  SpO2 94%     Wt Readings from Last 3 Encounters:  09/06/14 118 lb 12.8 oz (53.887 kg)  08/16/14 118 lb 9.6 oz (53.797 kg)  06/03/14 115 lb (52.164 kg)   General- elderly female, frail, in no acute distress Head- atraumatic, normocephalic Eyes- PERRLA, EOMI, no pallor, no icterus Neck- no lymphadenopathy, no thyromegaly Cardiovascular- normal s1,s2, no murmurs, no leg edema Respiratory- bilateral clear to auscultation, no wheeze, no rhonchi, no crackles Abdomen- bowel sounds present, soft, non tender Musculoskeletal- able to move all 4 extremities, on wheelchair, has to be propelled by others, tries to self propel by using her feet, has arthritis changes of her fingers Neurological- alert and oriented to self Psychiatry- normal mood and affect  Labs 05/09/14  WBC 7.5 hemoglobin 13.7 hematocrit 41.5 MCV 94.7 sodium 142 potassium 3.9 glucose 116 BUN 25 creatinine 0.7 calcium 8.9 total protein 6.2 albumin 3.3 total bilirubin 0.4 ALP 47 AST 13 ALT 16 cholesterol 186 HDL 33 and LDL 102 triglycerides 253 09/07/79  WBC 14.7 hemoglobin 13.5 hematocrit 41.0 glucose 228 BUN 33 creatinine 1.05 sodium 140 potassium 3.9 6/14 TSH 0.295, free T4-1 0.39, WBC 12.4 otherwise CBC normal, glucose 131, BUN 25, albumin 3.1 otherwise CMP normal, fasting lipid panel normal   Assessment/plan  Protein calorie malnutrition Weight has been stable, continue her  nutritional supplement medpass and procel. Continue mechanical soft diet  Senile dementia Stable, off all medications, continue assistance with ADLs, fall precautions, pressure ulcer prophylaxis, MVI supplement. She is DNR and comfort care is the main goal of care  Constipation Stable, continue senna  HTN Stable, off all bp medications  Hyperlipidemia Off all medications, LDL at goal

## 2014-11-08 LAB — CBC AND DIFFERENTIAL
HCT: 38 % (ref 36–46)
HEMOGLOBIN: 13.1 g/dL (ref 12.0–16.0)
PLATELETS: 267 10*3/uL (ref 150–399)
WBC: 7 10*3/mL

## 2014-11-08 LAB — BASIC METABOLIC PANEL
BUN: 19 mg/dL (ref 4–21)
Creatinine: 0.6 mg/dL (ref 0.5–1.1)
GLUCOSE: 129 mg/dL
POTASSIUM: 3.7 mmol/L (ref 3.4–5.3)
Sodium: 144 mmol/L (ref 137–147)

## 2014-11-08 LAB — LIPID PANEL
CHOLESTEROL: 155 mg/dL (ref 0–200)
HDL: 27 mg/dL — AB (ref 35–70)
LDL Cholesterol: 65 mg/dL
Triglycerides: 316 mg/dL — AB (ref 40–160)

## 2014-11-21 ENCOUNTER — Non-Acute Institutional Stay (SKILLED_NURSING_FACILITY): Payer: Medicare Other | Admitting: Adult Health

## 2014-11-21 ENCOUNTER — Encounter: Payer: Self-pay | Admitting: Adult Health

## 2014-11-21 DIAGNOSIS — I10 Essential (primary) hypertension: Secondary | ICD-10-CM

## 2014-11-21 DIAGNOSIS — E785 Hyperlipidemia, unspecified: Secondary | ICD-10-CM | POA: Diagnosis not present

## 2014-11-21 DIAGNOSIS — K5900 Constipation, unspecified: Secondary | ICD-10-CM | POA: Diagnosis not present

## 2014-11-21 DIAGNOSIS — E440 Moderate protein-calorie malnutrition: Secondary | ICD-10-CM | POA: Diagnosis not present

## 2014-11-21 DIAGNOSIS — F0390 Unspecified dementia without behavioral disturbance: Secondary | ICD-10-CM | POA: Diagnosis not present

## 2014-11-21 NOTE — Progress Notes (Signed)
Patient ID: Denise Lynn, female   DOB: 17-Jun-1918, 81 y.o.   MRN: [id number]   11/21/14  Facility:  Nursing Home Location:  [facility] Room Number: 1005-1 LEVEL OF CARE:  SNF (31)  Routine Visit  Chief Complaint  Patient presents with  . Medical Management of Chronic Issues    Hypertension, protein calorie malnutrition, constipation, hyperlipidemia and dementia    HISTORY OF PRESENT ILLNESS:  This is a 81 year old female who is a long-term resident at [facility]. She has been stable for the past month. She has past medical history of hypercholesterolemia, dementia and constipation. She has Protein-calorie malnutrition. Current weight is 117.4 lbs - stable. PO intake is 50-75%. Latest albumin is 3.0, low. Hypertension is well-controlled and currently off all BP meds.  PAST MEDICAL HISTORY:  Past Medical History  Diagnosis Date  . Hypercholesteremia   . Dementia     CURRENT MEDICATIONS: Reviewed per MAR/see medication list  Allergies  Allergen Reactions  . Demerol [Meperidine] Other (See Comments)    agitation  . Lodine [Etodolac] Other (See Comments)    Mouth blisters  . Tylenol [Acetaminophen] Other (See Comments)    dizziness     REVIEW OF SYSTEMS:  Unobtainable due to Dementia  PHYSICAL EXAMINATION  GENERAL: no acute distress, normal body habitus EYES:  EYES: conjunctivae normal, sclerae normal, normal eye lids NECK: supple, trachea midline, no neck masses, no thyroid tenderness, no thyromegaly LYMPHATICS: no LAN in the neck, no supraclavicular LAN RESPIRATORY: breathing is even & unlabored, BS CTAB CARDIAC: RRR, no murmur,no extra heart sounds, no edema GI: abdomen soft, normal BS, no masses, no tenderness, no hepatomegaly, no splenomegaly EXTREMITIES: able to move all 4 extremities; uses wheelchair and propels with her feet PSYCHIATRIC: the patient is alert to self; affect & behavior appropriate  LABS/RADIOLOGY: Labs  Reviewed:  11/08/14  WBC 7.0 hemoglobin 13.1 hematocrit 38.5 MCV 91.9 platelet 267 sodium 144 potassium 3.7 and glucose 129 BUN 19 creatinine 0.65 total bilirubin 0.3 alkaline phosphatase 61 SGOT 16 SGPT 14 total protein 5.8 albumin 3.0 cholesterol 155 triglycerides 316 HDL 27 LDL 65 08/19/14  WBC 7.3 hemoglobin 13.6 hematocrit 40.2 MCV 92.6 platelet 261 sodium 142 potassium 3.8 glucose on 114 BUN 17 creatinine 0.59 total bilirubin 0.4 alkaline phosphatase 61 SGOT 15 SGPT 16 total protein 6.0 albumin 3.1 calcium 8.5 05/09/14  WBC 7.5 hemoglobin 13.7 hematocrit 41.5 MCV 94.7 sodium 142 potassium 3.9 glucose 116 BUN 25 creatinine 0.7 calcium 8.9 total protein 6.2 albumin 3.3 total bilirubin 0.4 ALP 47 AST 13 ALT 16 cholesterol 186 HDL 33 and LDL 102 triglycerides 253 0/37/04  WBC 14.7 hemoglobin 13.5 hematocrit 41.0 glucose 228 BUN 33 creatinine 1.05 sodium 140 potassium 3.9 6/14 TSH 0.295, free T4-1 0.39, WBC 12.4 otherwise CBC normal, glucose 131, BUN 25, albumin 3.1 otherwise CMP normal, fasting lipid panel normal 12/13 WBC 12.5, hemoglobin 11.4, MCV 90.3, platelets 360, albumin 2.4, total protein 5.5 otherwise liver profile normal, hemoglobin A1c 6.8, fasting lipid panel normal,  vitamin D level 39   ASSESSMENT/PLAN:  Protein calorie malnutrition, severe - albumin 3.0 ;continue protein supplementation Senile dementia - stable Constipation -  Continue senna S 1 tablet by mouth daily at bedtime Hypertension - well-controlled; off all medications Hyperlipidemia - off all medications Dementia - continue supportive care   Goals of care:  Long-term care  Labs/test ordered:  none     [facility], NP [facility] [phone]

## 2014-12-26 ENCOUNTER — Encounter: Payer: Self-pay | Admitting: Adult Health

## 2014-12-29 NOTE — Progress Notes (Signed)
This encounter was created in error - please disregard.

## 2015-01-17 ENCOUNTER — Non-Acute Institutional Stay (SKILLED_NURSING_FACILITY): Payer: Medicare Other | Admitting: Adult Health

## 2015-01-17 ENCOUNTER — Encounter: Payer: Self-pay | Admitting: Adult Health

## 2015-01-17 DIAGNOSIS — E785 Hyperlipidemia, unspecified: Secondary | ICD-10-CM | POA: Diagnosis not present

## 2015-01-17 DIAGNOSIS — E440 Moderate protein-calorie malnutrition: Secondary | ICD-10-CM

## 2015-01-17 DIAGNOSIS — I10 Essential (primary) hypertension: Secondary | ICD-10-CM

## 2015-01-17 DIAGNOSIS — K5900 Constipation, unspecified: Secondary | ICD-10-CM

## 2015-01-17 DIAGNOSIS — F0390 Unspecified dementia without behavioral disturbance: Secondary | ICD-10-CM | POA: Diagnosis not present

## 2015-02-05 LAB — TSH: TSH: 2.73 u[IU]/mL (ref <=5.90)

## 2015-03-30 NOTE — Progress Notes (Addendum)
Patient ID: Denise LauthJeanne D Devoto, female   DOB: Jan 10, 1919, 81 y.o.   MRN: [id number]   01/17/15  Facility:  Nursing Home Location:  [facility] Room Number: 1005-1 LEVEL OF CARE:  SNF (31)  Routine Visit  Chief Complaint  Patient presents with  . Medical Management of Chronic Issues    Hypertension, protein calorie malnutrition, constipation, hyperlipidemia and dementia    HISTORY OF PRESENT ILLNESS:  This is a 81 year old female who is a long[facility] resident at [facility]. She has been stable for the past month. She has past medical history of hypercholesterolemia, dementia and constipation. She has Protein-calorie malnutrition. Current weight is 116.8 lbs - stable. Hypertension is well-controlled and currently off all BP meds.  PAST MEDICAL HISTORY:  Past Medical History  Diagnosis Date  . Hypercholesteremia   . Dementia     CURRENT MEDICATIONS: Reviewed per MAR/see medication list   Medication List       This list is accurate as of: 01/17/15 11:59 PM.  Always use your most recent med list.               multivitamin with minerals Tabs tablet  Take 1 tablet by mouth daily.     PROCEL Powd  Take by mouth. Administer 1 scoop by mouth twice daily for nutritional support (285 gm can)     sennosides-docusate sodium 8.6-50 MG tablet  Commonly known as:  SENOKOT-S  Take 1 tablet by mouth at bedtime. For constipation         Allergies  Allergen Reactions  . Demerol [Meperidine] Other (See Comments)    agitation  . Lodine [Etodolac] Other (See Comments)    Mouth blisters  . Tylenol [Acetaminophen] Other (See Comments)    dizziness    REVIEW OF SYSTEMS:  Unobtainable due to Dementia  PHYSICAL EXAMINATION  GENERAL: no acute distress, normal body habitus EYES:  EYES: conjunctivae normal, sclerae normal, normal eye lids NECK: supple, trachea midline, no neck masses, no thyroid tenderness, no thyromegaly LYMPHATICS: no LAN in the neck, no  supraclavicular LAN RESPIRATORY: breathing is even & unlabored, BS CTAB CARDIAC: RRR, no murmur,no extra heart sounds, no edema GI: abdomen soft, normal BS, no masses, no tenderness, no hepatomegaly, no splenomegaly EXTREMITIES: able to move all 4 extremities; uses wheelchair and propels with her feet PSYCHIATRIC: the patient is alert to self only; affect & behavior appropriate  LABS/RADIOLOGY: Labs Reviewed: 11/08/14  WBC 7.0 hemoglobin 13.1 hematocrit 38.5 MCV 91.9 platelet 267 sodium 144 potassium 3.7 and glucose 129 BUN 19 creatinine 0.65 total bilirubin 0.3 alkaline phosphatase 61 SGOT 16 SGPT 14 total protein 5.8 albumin 3.0 cholesterol 155 triglycerides 316 HDL 27 LDL 65 08/19/14  WBC 7.3 hemoglobin 13.6 hematocrit 40.2 MCV 92.6 platelet 261 sodium 142 potassium 3.8 glucose on 114 BUN 17 creatinine 0.59 total bilirubin 0.4 alkaline phosphatase 61 SGOT 15 SGPT 16 total protein 6.0 albumin 3.1 calcium 8.5 05/09/14  WBC 7.5 hemoglobin 13.7 hematocrit 41.5 MCV 94.7 sodium 142 potassium 3.9 glucose 116 BUN 25 creatinine 0.7 calcium 8.9 total protein 6.2 albumin 3.3 total bilirubin 0.4 ALP 47 AST 13 ALT 16 cholesterol 186 HDL 33 and LDL 102 triglycerides 253 7/82/957/28/15  WBC 14.7 hemoglobin 13.5 hematocrit 41.0 glucose 228 BUN 33 creatinine 1.05 sodium 140 potassium 3.9 6/14 TSH 0.295, free T4-1 0.39, WBC 12.4 otherwise CBC normal, glucose 131, BUN 25, albumin 3.1 otherwise CMP normal, fasting lipid panel normal 12/13 WBC 12.5, hemoglobin 11.4, MCV  90.3, platelets 360, albumin 2.4, total protein 5.5 otherwise liver profile normal, hemoglobin A1c 6.8, fasting lipid panel normal,  vitamin D level 39   ASSESSMENT/PLAN:  Protein calorie malnutrition, severe - albumin 3.0 ;continue protein supplementation Senile dementia - continue supportive care Constipation -  Continue senna S 1 tablet by mouth daily at bedtime Hypertension - well-controlled; off all medications Hyperlipidemia - off all  medications     Goals of care:  Long-term care    [facility], NP [facility] [phone]

## 2015-04-07 ENCOUNTER — Non-Acute Institutional Stay (SKILLED_NURSING_FACILITY): Payer: Medicare Other | Admitting: Internal Medicine

## 2015-04-07 ENCOUNTER — Encounter: Payer: Self-pay | Admitting: Internal Medicine

## 2015-04-07 DIAGNOSIS — F0390 Unspecified dementia without behavioral disturbance: Secondary | ICD-10-CM | POA: Diagnosis not present

## 2015-04-07 DIAGNOSIS — K5900 Constipation, unspecified: Secondary | ICD-10-CM

## 2015-04-07 DIAGNOSIS — E440 Moderate protein-calorie malnutrition: Secondary | ICD-10-CM

## 2015-04-07 DIAGNOSIS — I10 Essential (primary) hypertension: Secondary | ICD-10-CM | POA: Diagnosis not present

## 2015-04-07 DIAGNOSIS — E785 Hyperlipidemia, unspecified: Secondary | ICD-10-CM

## 2015-04-07 NOTE — Progress Notes (Signed)
Patient ID: Denise Lynn, female   DOB: 1918/07/06, 81 y.o.   MRN: [id number]    [facility]  Chief Complaint  Patient presents with  . Medical Management of Chronic Issues    Routine Visit    Allergies  Allergen Reactions  . Demerol [Meperidine] Other (See Comments)    agitation  . Lodine [Etodolac] Other (See Comments)    Mouth blisters  . Tylenol [Acetaminophen] Other (See Comments)    dizziness   Code status DNR  HPI 81 y/o female patient is seen today for routine visit. She has been at her baseline. She had a fall 3 weeks back with no apparent injury. She has pmh of HTN, constipation, dementia and protein calorie malnutrition. No skin concerns. No new behavioral concerns. She participates with activities. No new concern from staff. Unable to obtain HPI or ROS from patient today as she refuses to participate except for some ye/ no answers by nodding her head.  Review of Systems  Constitutional: Negative for fever, chills Respiratory: Negative for cough, shortness of breath and wheezing.   Cardiovascular: Negative for chest pain, palpitations Gastrointestinal: Negative for heartburn, nausea, vomiting, abdominal pain.  Genitourinary: Negative for dysuria Skin: Negative for itching and rash.    Past Medical History  Diagnosis Date  . Hypercholesteremia   . Dementia       Medication List       This list is accurate as of: 04/07/15  6:19 PM.  Always use your most recent med list.               multivitamin with minerals Tabs tablet  Take 1 tablet by mouth daily.     PROCEL Powd  Take by mouth. Administer 1 scoop by mouth twice daily for nutritional support (285 gm can)     sennosides-docusate sodium 8.6-50 MG tablet  Commonly known as:  SENOKOT-S  Take 1 tablet by mouth at bedtime. For constipation       Physical exam BP 130/94 mmHg  Pulse 85  Temp(Src) 98.5 F (36.9 C) (Oral)  Resp 18  SpO2 81%     Wt Readings  from Last 3 Encounters:  01/17/15 116 lb 12.8 oz (52.98 kg)  12/26/14 119 lb 12.8 oz (54.341 kg)  11/21/14 117 lb 6.4 oz (53.252 kg)   General- elderly female, frail, in no acute distress Head- atraumatic, normocephalic Eyes- PERRLA, EOMI, no pallor, no icterus Neck- no lymphadenopathy, no thyromegaly Cardiovascular- normal s1,s2, no murmurs, no leg edema Respiratory- bilateral clear to auscultation, no wheeze, no rhonchi, no crackles Abdomen- bowel sounds present, soft, non tender Musculoskeletal- able to move all 4 extremities, on wheelchair, has to be propelled by others, tries to self propel by using her feet, has arthritis changes of her fingers Neurological- alert and oriented to self  Labs 02/05/15 tsh 2.72 11/08/14 wbc 7, hb 13.1, plt 267, na 144, k 3.7, alb 3, bun 19, cr 0.65, t.chol 155, tg 316, hdl 27, ldl 65 05/09/14  WBC 7.5 hemoglobin 13.7 hematocrit 41.5 MCV 94.7 sodium 142 potassium 3.9 glucose 116 BUN 25 creatinine 0.7 calcium 8.9 total protein 6.2 albumin 3.3 total bilirubin 0.4 ALP 47 AST 13 ALT 16 cholesterol 186 HDL 33 and LDL 102 triglycerides 253 09/07/79  WBC 14.7 hemoglobin 13.5 hematocrit 41.0 glucose 228 BUN 33 creatinine 1.05 sodium 140 potassium 3.9  Assessment/plan  HTN DBP elevated today, currently asymptomatic, monitor bp daily x 1 week and then weekly. off all bp  medications at present  Constipation Stable, continue senokot s  Hyperlipidemia Off all medications, LDL at goal, lab reviewed  Protein calorie malnutrition Weight has been stable, continue her nutritional supplement medpass and procel. Continue mechanical soft diet  Senile dementia Stable, off all medications, continue assistance with ADLs, fall precautions, pressure ulcer prophylaxis, MVI supplement. She is DNR and comfort care is the main goal of care. Decline anticipated  Oneal GroutMAHIMA Orell Hurtado, MD  [facility] [phone] (Monday-Friday 8 am - 5 pm) [phone]  (afterhours)

## 2015-06-13 NOTE — Addendum Note (Signed)
Addended by: Lodema HongSIMPSON, Damica Gravlin A on: 06/13/2015 02:23 PM   Modules accepted: Medications

## 2015-06-16 ENCOUNTER — Non-Acute Institutional Stay (SKILLED_NURSING_FACILITY): Payer: Medicare Other | Admitting: Adult Health

## 2015-06-16 ENCOUNTER — Encounter: Payer: Self-pay | Admitting: Adult Health

## 2015-06-16 DIAGNOSIS — E785 Hyperlipidemia, unspecified: Secondary | ICD-10-CM

## 2015-06-16 DIAGNOSIS — E440 Moderate protein-calorie malnutrition: Secondary | ICD-10-CM

## 2015-06-16 DIAGNOSIS — K5900 Constipation, unspecified: Secondary | ICD-10-CM

## 2015-06-16 DIAGNOSIS — I10 Essential (primary) hypertension: Secondary | ICD-10-CM | POA: Diagnosis not present

## 2015-06-16 DIAGNOSIS — F0390 Unspecified dementia without behavioral disturbance: Secondary | ICD-10-CM | POA: Diagnosis not present

## 2015-06-16 NOTE — Progress Notes (Signed)
Patient ID: Myrtice LauthJeanne D Lynn, female   DOB: 1918/09/26, 82 y.o.   MRN: [id number]   DATE:   06/16/15  Facility:  Nursing Home Location:  [facility] Room Number: 1005-1 LEVEL OF CARE:  SNF (31)  Routine Visit  Chief Complaint  Patient presents with  . Medical Management of Chronic Issues    Hypertension, constipation, protein calorie malnutrition and dementia    HISTORY OF PRESENT ILLNESS:  This is a 82 year old female who [facility] She has past medical history of hypercholesterolemia, dementia and constipation. She has Protein-calorie malnutrition. Current weight is 117.6 lbs - has been gaining weight. Hypertension is well-controlled and currently off all BP meds.  PAST MEDICAL HISTORY:  Past Medical History  Diagnosis Date  . Hypercholesteremia   . Dementia     CURRENT MEDICATIONS: Reviewed per MAR/see medication list   Medication List       This list is accurate as of: 06/16/15 10:44 PM.  Always use your most recent med list.               CERTAVITE/ANTIOXIDANTS PO  Take by mouth. Take one tablet by mouth daily              multivitamin with minerals Tabs tablet  Take 1 tablet by mouth daily.     PROCEL PO  Take by mouth. Administer 1 scoop by mouth twice daily for nutritional support     sennosides-docusate sodium 8.6-50 MG tablet  Commonly known as:  SENOKOT-S  Take 1 tablet by mouth at bedtime. For constipation         Allergies  Allergen Reactions  . Demerol [Meperidine] Other (See Comments)    agitation  . Lodine [Etodolac] Other (See Comments)    Mouth blisters  . Tylenol [Acetaminophen] Other (See Comments)    dizziness    REVIEW OF SYSTEMS:  Unobtainable due to Dementia  PHYSICAL EXAMINATION  GENERAL: no acute distress, normal body habitus EYES:  EYES: conjunctivae normal, sclerae normal, normal eye lids NECK: supple, trachea midline, no neck masses, no thyroid tenderness, no  thyromegaly LYMPHATICS: no LAN in the neck, no supraclavicular LAN RESPIRATORY: breathing is even & unlabored, BS CTAB CARDIAC: RRR, no murmur,no extra heart sounds, no edema GI: abdomen soft, normal BS, no masses, no tenderness, no hepatomegaly, no splenomegaly EXTREMITIES: able to move all 4 extremities; uses wheelchair and propels with her feet PSYCHIATRIC: the patient is alert to self only; affect & behavior appropriate  LABS/RADIOLOGY: Labs Reviewed: 11/08/14  WBC 7.0 hemoglobin 13.1 hematocrit 38.5 MCV 91.9 platelet 267 sodium 144 potassium 3.7 and glucose 129 BUN 19 creatinine 0.65 total bilirubin 0.3 alkaline phosphatase 61 SGOT 16 SGPT 14 total protein 5.8 albumin 3.0 cholesterol 155 triglycerides 316 HDL 27 LDL 65 08/19/14  WBC 7.3 hemoglobin 13.6 hematocrit 40.2 MCV 92.6 platelet 261 sodium 142 potassium 3.8 glucose on 114 BUN 17 creatinine 0.59 total bilirubin 0.4 alkaline phosphatase 61 SGOT 15 SGPT 16 total protein 6.0 albumin 3.1 calcium 8.5 05/09/14  WBC 7.5 hemoglobin 13.7 hematocrit 41.5 MCV 94.7 sodium 142 potassium 3.9 glucose 116 BUN 25 creatinine 0.7 calcium 8.9 total protein 6.2 albumin 3.3 total bilirubin 0.4 ALP 47 AST 13 ALT 16 cholesterol 186 HDL 33 and LDL 102 triglycerides 253 4/09/817/28/15  WBC 14.7 hemoglobin 13.5 hematocrit 41.0 glucose 228 BUN 33 creatinine 1.05 sodium 140 potassium 3.9 6/14 TSH 0.295, free T4-1 0.39, WBC 12.4 otherwise CBC normal,  glucose 131, BUN 25, albumin 3.1 otherwise CMP normal, fasting lipid panel normal 12/13 WBC 12.5, hemoglobin 11.4, MCV 90.3, platelets 360, albumin 2.4, total protein 5.5 otherwise liver profile normal, hemoglobin A1c 6.8, fasting lipid panel normal,  vitamin D level 39   ASSESSMENT/PLAN:  Protein calorie malnutrition, severe - ;continue Procel 1 scoop by mouth twice a day  Senile dementia - continue supportive care  Constipation -  Continue senna S 1 tablet by mouth daily at bedtime  Hypertension - well-controlled; off  all medications  Hyperlipidemia - off all medications     Goals of care:  Long-term care    [facility], NP [facility] [phone]

## 2015-07-08 ENCOUNTER — Encounter: Payer: Self-pay | Admitting: Adult Health

## 2015-07-08 ENCOUNTER — Non-Acute Institutional Stay (SKILLED_NURSING_FACILITY): Payer: Medicare Other | Admitting: Adult Health

## 2015-07-08 DIAGNOSIS — F0390 Unspecified dementia without behavioral disturbance: Secondary | ICD-10-CM | POA: Diagnosis not present

## 2015-07-08 DIAGNOSIS — E440 Moderate protein-calorie malnutrition: Secondary | ICD-10-CM | POA: Diagnosis not present

## 2015-07-08 DIAGNOSIS — K5900 Constipation, unspecified: Secondary | ICD-10-CM | POA: Diagnosis not present

## 2015-07-08 DIAGNOSIS — I10 Essential (primary) hypertension: Secondary | ICD-10-CM | POA: Diagnosis not present

## 2015-07-08 NOTE — Progress Notes (Signed)
Patient ID: SHEENA DONEGAN, female   DOB: 12-22-1918, 82 y.o.   MRN: [id number]    DATE:  07/08/2015   MRN:  [id number]  BIRTHDAY: Dec 29, 1918  Facility:  Nursing Home Location:  [facility]  Nursing Home Room Number: 1005-1  LEVEL OF CARE:  SNF (31)  Contact Information    Name Relation Home Work Mobile   Durbin,Bill Son [phone]     Ewing,Carol Daughter   [phone]       Code Status History    Date Active Date Inactive Code Status Order ID Comments User Context   04/26/2012  1:49 PM 04/29/2012  2:26 PM Full Code [id number]  Zerita Boers, RN Inpatient   04/26/2012 10:42 AM 04/26/2012  1:49 PM Full Code [id number]  Catarina Hartshorn, MD ED    Advance Directive Documentation        Most Recent Value   Type of Advance Directive  Out of facility DNR (pink MOST or yellow form)   Pre-existing out of facility DNR order (yellow form or pink MOST form)     "MOST" Form in Place?         Chief Complaint  Patient presents with  . Medical Management of Chronic Issues    Dementia, hypertension, constipation and protein calorie malnutrition    HISTORY OF PRESENT ILLNESS:  This is a 82 year old female who is being seen for a routine visit. She is a long-term resident of [address] She was recently started on Preservision for supplementation. Her weight is stable, 114.2 lbs. Latest BP 120/60, well-controlled and not on medication. She has advanced dementia.  PAST MEDICAL HISTORY:  Past Medical History  Diagnosis Date  . Hypercholesteremia   . Dementia   . Protein calorie malnutrition (HCC)   . Constipation   . HLD (hyperlipidemia)      CURRENT MEDICATIONS: Reviewed  Patient's Medications  New Prescriptions   No medications on file  Previous Medications   MULTIPLE VITAMINS-MINERALS (PRESERVISION/LUTEIN PO)    Take 1 capsule by mouth 2 (two) times daily.   OXYGEN    Inhale into the lungs as needed. 2L/min via [place] as needed for SOB and wheezing   PROTEIN  (PROCEL PO)    Take 1 scoop by mouth 2 (two) times daily.    SENNOSIDES-DOCUSATE SODIUM (SENOKOT-S) 8.6-50 MG TABLET    Take 1 tablet by mouth at bedtime. For constipation   ZINC OXIDE (SECURA EXTRA PROTECTIVE) 30.6 % CREA    Apply topically daily. Apply between buttocks as needed after each brief change  Modified Medications   No medications on file  Discontinued Medications   MULTIPLE VITAMINS-MINERALS (CERTAVITE/ANTIOXIDANTS PO)    Take 1 tablet by mouth daily.      Allergies  Allergen Reactions  . Demerol [Meperidine] Other (See Comments)    agitation  . Lodine [Etodolac] Other (See Comments)    Mouth blisters  . Tylenol [Acetaminophen] Other (See Comments)    dizziness     REVIEW OF SYSTEMS: unable to obtain due to advanced dementia  PHYSICAL EXAMINATION  GENERAL APPEARANCE: Well nourished. In no acute distress. Normal body habitus SKIN:  Skin is warm and dry. There are no suspicious lesions or rash HEAD: Normal in size and contour. No evidence of trauma EYES: Lids open and close normally. No blepharitis, entropion or ectropion. PERRL. Conjunctivae are clear and sclerae are white. Lenses are without opacity EARS: Pinnae are normal. Hard of hearing MOUTH and THROAT: Lips are without lesions. Oral  mucosa is moist and without lesions. Tongue is normal in shape, size, and color and without lesions NECK: supple, trachea midline, no neck masses, no thyroid tenderness, no thyromegaly LYMPHATICS: no LAN in the neck, no supraclavicular LAN RESPIRATORY: breathing is even & unlabored, BS CTAB CARDIAC: RRR, no murmur,no extra heart sounds, no edema GI: abdomen soft, normal BS, no masses, no tenderness, no hepatomegaly, no splenomegaly EXTREMITIES:  Able to move X 4 extremities; bilateral hand fingers are contracted and uses splint PSYCHIATRIC: Alert and oriented to person and confused to time and place. Affect and behavior are appropriate  LABS/RADIOLOGY: Labs reviewed: Basic  Metabolic Panel:  Recent Labs  45/40/98 11/08/14  NA 142 144  K 3.8 3.7  BUN 17 19  CREATININE 0.6 0.6   Liver Function Tests:  Recent Labs  08/19/14  AST 15  ALT 16  ALKPHOS 61   CBC:  Recent Labs  08/19/14 11/08/14  WBC 7.3 7.0  HGB 13.6 13.1  HCT 40 38  PLT 261 267   Lipid Panel:  Recent Labs  11/08/14  HDL 27*     ASSESSMENT/PLAN:  Dementia - advanced; continue supportive care  Constipation - continue senna S1 tab by mouth daily at bedtime  Protein calorie malnutrition - continue Procel 1 scoop by mouth twice a day  Hypertension - well-controlled without medication     Goals of care:  Long-term care    [facility], NP [facility] [phone]

## 2015-08-01 ENCOUNTER — Non-Acute Institutional Stay (SKILLED_NURSING_FACILITY): Payer: Medicare Other | Admitting: Adult Health

## 2015-08-01 ENCOUNTER — Encounter: Payer: Self-pay | Admitting: Adult Health

## 2015-08-01 DIAGNOSIS — F0390 Unspecified dementia without behavioral disturbance: Secondary | ICD-10-CM | POA: Diagnosis not present

## 2015-08-01 DIAGNOSIS — K5900 Constipation, unspecified: Secondary | ICD-10-CM | POA: Diagnosis not present

## 2015-08-01 DIAGNOSIS — E440 Moderate protein-calorie malnutrition: Secondary | ICD-10-CM

## 2015-08-01 DIAGNOSIS — I10 Essential (primary) hypertension: Secondary | ICD-10-CM

## 2015-08-01 NOTE — Progress Notes (Signed)
Patient ID: Denise Lynn, female   DOB: [birth year], 82 y.o.   MRN: [id number]    DATE:  08/01/2015   MRN:  [id number]  BIRTHDAY: 04/23/1919  Facility:  Nursing Home Location:  [facility]  Nursing Home Room Number: 1005-1  LEVEL OF CARE:  SNF (31)  Contact Information    Name Relation Home Work Mobile   Holtmeyer,Bill Son [phone]     Ewing,Carol Daughter   [phone]       Code Status History    Date Active Date Inactive Code Status Order ID Comments User Context   04/26/2012  1:49 PM 04/29/2012  2:26 PM Full Code [id number]  Zerita Boers, RN Inpatient   04/26/2012 10:42 AM 04/26/2012  1:49 PM Full Code [id number]  Catarina Hartshorn, MD ED    Advance Directive Documentation        Most Recent Value   Type of Advance Directive  Out of facility DNR (pink MOST or yellow form)   Pre-existing out of facility DNR order (yellow form or pink MOST form)     "MOST" Form in Place?         Chief Complaint  Patient presents with  . Medical Management of Chronic Issues    HISTORY OF PRESENT ILLNESS:  This is a 82 year old female who is being seen for a routine visit. She is a long-term resident of [address]. Certavite was recently discontinued since she is already on Preservision. BPs reviewed -  138/80, 136/68, 130/82, 96/68. She is on no medication for hypertension. She is HOH and has advanced dementia so she is not able to answer queries regarding review of systems. She is for comfort measures only and no hospitalizations. She has been stable for the past month.   PAST MEDICAL HISTORY:  Past Medical History  Diagnosis Date  . Hypercholesteremia   . Dementia   . Protein calorie malnutrition (HCC)   . Constipation   . HLD (hyperlipidemia)   . Benign essential hypertension   . Protein calorie malnutrition (HCC)      CURRENT MEDICATIONS: Reviewed  Patient's Medications  New Prescriptions   No medications on file  Previous Medications   MULTIPLE  VITAMINS-MINERALS (PRESERVISION/LUTEIN PO)    Take 1 capsule by mouth 2 (two) times daily.   OXYGEN    Inhale 2 L/min into the lungs as needed.    PROTEIN (PROCEL PO)    Take 1 scoop by mouth 2 (two) times daily.    SENNOSIDES-DOCUSATE SODIUM (SENOKOT-S) 8.6-50 MG TABLET    Take 1 tablet by mouth at bedtime. For constipation   ZINC OXIDE (SECURA EXTRA PROTECTIVE) 30.6 % CREA    Apply topically daily. Apply between buttocks as needed after each brief change  Modified Medications   No medications on file  Discontinued Medications   No medications on file     Allergies  Allergen Reactions  . Demerol [Meperidine] Other (See Comments)    agitation  . Lodine [Etodolac] Other (See Comments)    Mouth blisters  . Tylenol [Acetaminophen] Other (See Comments)    dizziness     REVIEW OF SYSTEMS: unable to obtain due to advanced dementia  PHYSICAL EXAMINATION  GENERAL APPEARANCE:  In no acute distress. Normal body habitus SKIN:  Skin is warm and dry.  HEAD: Normal in size and contour. No evidence of trauma EYES: Lids open and close normally. No blepharitis, entropion or ectropion. PERRL. Conjunctivae are clear and sclerae are white. Lenses are without  opacity EARS: Pinnae are normal. Hard of hearing MOUTH and THROAT: Lips are without lesions. Oral mucosa is moist and without lesions. Tongue is normal in shape, size, and color and without lesions NECK: supple, trachea midline, no neck masses, no thyroid tenderness, no thyromegaly LYMPHATICS: no LAN in the neck, no supraclavicular LAN RESPIRATORY: breathing is even & unlabored, BS CTAB CARDIAC: RRR, no murmur,no extra heart sounds, no edema GI: abdomen soft, normal BS, no masses, no tenderness, no hepatomegaly, no splenomegaly EXTREMITIES:  Able to move X 4 extremities; bilateral hand fingers are contracted and uses splint; BLE generalized weakness PSYCHIATRIC: Alert and oriented to person and confused to time and place. Affect and behavior  are appropriate  LABS/RADIOLOGY: Labs reviewed: Basic Metabolic Panel:  Recent Labs  40/98/1103/21/16 11/08/14  NA 142 144  K 3.8 3.7  BUN 17 19  CREATININE 0.6 0.6   Liver Function Tests:  Recent Labs  08/19/14  AST 15  ALT 16  ALKPHOS 61   CBC:  Recent Labs  08/19/14 11/08/14  WBC 7.3 7.0  HGB 13.6 13.1  HCT 40 38  PLT 261 267   Lipid Panel:  Recent Labs  11/08/14  HDL 27*     ASSESSMENT/PLAN:  Dementia - advanced; continue supportive care; continue scoop mattress to prevent falls  Constipation - continue senna S1 tab by mouth daily at bedtime  Protein calorie malnutrition - continue Procel 1 scoop by mouth twice a day; latest weight 114.2  lbs, stable  Hypertension - well-controlled without medication     Goals of care:  Long-term care    [facility], NP [facility] [phone]

## 2015-09-03 ENCOUNTER — Encounter: Payer: Self-pay | Admitting: Internal Medicine

## 2015-09-03 ENCOUNTER — Non-Acute Institutional Stay (SKILLED_NURSING_FACILITY): Payer: Medicare Other | Admitting: Internal Medicine

## 2015-09-03 DIAGNOSIS — K5900 Constipation, unspecified: Secondary | ICD-10-CM | POA: Diagnosis not present

## 2015-09-03 DIAGNOSIS — F0390 Unspecified dementia without behavioral disturbance: Secondary | ICD-10-CM | POA: Insufficient documentation

## 2015-09-03 DIAGNOSIS — E46 Unspecified protein-calorie malnutrition: Secondary | ICD-10-CM

## 2015-09-03 DIAGNOSIS — K5909 Other constipation: Secondary | ICD-10-CM | POA: Insufficient documentation

## 2015-09-03 DIAGNOSIS — I10 Essential (primary) hypertension: Secondary | ICD-10-CM | POA: Diagnosis not present

## 2015-09-03 NOTE — Progress Notes (Signed)
Patient ID: Denise Lynn, female   DOB: 1918/11/05, 82 y.o.   MRN: [id number]    [facility]  Chief Complaint  Patient presents with  . Medical Management of Chronic Issues    Routine Visit   Allergies  Allergen Reactions  . Demerol [Meperidine] Other (See Comments)    agitation  . Lodine [Etodolac] Other (See Comments)    Mouth blisters  . Tylenol [Acetaminophen] Other (See Comments)    dizziness   Code status DNR  Advanced Directives 08/01/2015  Does patient have an advance directive? Yes  Type of Advance Directive Out of facility DNR (pink MOST or yellow form);Healthcare Power of Attorney  Does patient want to make changes to advanced directive? No - Patient declined  Copy of advanced directive(s) in chart? Yes     HPI 82 y/o female patient is seen today for routine visit. She is sitting on her Rocking go chair. She has been at her baseline. No recent fall reported. No skin concerns. No acute behavioral issues. She is not participating in HPI and ROS. No new concern from nursing staff. She has been losing weight on chart review. bp reading has been stable.   Review of Systems  Unable to obtain    Past Medical History  Diagnosis Date  . Hypercholesteremia   . Dementia   . Protein calorie malnutrition (HCC)   . Constipation   . HLD (hyperlipidemia)   . Benign essential hypertension   . Protein calorie malnutrition (HCC)       Medication List       This list is accurate as of: 09/03/15  2:18 PM.  Always use your most recent med list.               OXYGEN  Inhale 2 L/min into the lungs as needed.     PRESERVISION/LUTEIN PO  Take 1 capsule by mouth 2 (two) times daily.     PROCEL PO  Take 1 scoop by mouth 2 (two) times daily.     SECURA EXTRA PROTECTIVE 30.6 % Crea  Generic drug:  Zinc Oxide  Apply topically daily. Apply between buttocks as needed after each brief change     sennosides-docusate sodium 8.6-50 MG tablet   Commonly known as:  SENOKOT-S  Take 1 tablet by mouth at bedtime. For constipation       Physical exam BP 124/82 mmHg  Pulse 98  Temp(Src) 97.6 F (36.4 C) (Oral)  Resp 16  Ht  (1.651 m)  Wt 110 lb 6.4 oz (50.077 kg)  BMI 18.37 kg/m2  SpO2 96%     Wt Readings from Last 3 Encounters:  09/03/15 110 lb 6.4 oz (50.077 kg)  08/01/15 115 lb (52.164 kg)  07/08/15 115 lb (52.164 kg)   General- elderly female, frail, in no acute distress Head- atraumatic, normocephalic Eyes- PERRLA, EOMI, no pallor, no icterus, wears glasses Neck- no lymphadenopathy, no thyromegaly Cardiovascular- normal s1,s2, no murmurs, no leg edema Respiratory- bilateral clear to auscultation, no wheeze, no rhonchi, no crackles Abdomen- bowel sounds present, soft, non tender Musculoskeletal- able to move all 4 extremities, on wheelchair, has to be propelled by others, tries to self propel by using her feet, has arthritis changes of her fingers, has brace to her hands Neurological- alert and oriented to self only  Labs CBC Latest Ref Rng 11/08/2014 08/19/2014 12/25/2013  WBC - 7.0 7.3 14.7(H)  Hemoglobin 12.0 - 16.0 g/dL 40.9 81.1 91.4  Hematocrit 36 -  46 % 38 40 41.0  Platelets 150 - 399 K/L 267 261 256   CMP Latest Ref Rng 11/08/2014 08/19/2014 12/25/2013  Glucose 65-99 mg/dL - - 161(W228(H)  BUN 4 - 21 mg/dL 19 17 96(E33(H)  Creatinine 0.5 - 1.1 mg/dL 0.6 0.6 4.541.05  Sodium 098137 - 147 mmol/L 144 142 140  Potassium 3.4 - 5.3 mmol/L 3.7 3.8 3.9  Chloride 98-107 mmol/L - - 104  CO2 21-32 mmol/L - - 24  Calcium 8.5-10.1 mg/dL - - 8.8  Total Protein 6.0 - 8.3 g/dL - - -  Total Bilirubin 0.3 - 1.2 mg/dL - - -  Alkaline Phos 25 - 125 U/L - 61 -  AST 13 - 35 U/L - 15 -  ALT 7 - 35 U/L - 16 -   Lab Results  Component Value Date   TSH 2.73 02/05/2015   Lipid Panel     Component Value Date/Time   CHOL 155 11/08/2014   TRIG 316* 11/08/2014   HDL 27* 11/08/2014   LDLCALC 65 11/08/2014      Assessment/plan  Protein calorie malnutrition Losing weight. Decline anticipated with her dementia. Continue procel supplement and MVI.   Dementia No behavioral disturbance. Continue supportive care.   HTN Stable bp reading, off all medication. Monitor clinically  Constipation Stable, continue senokot s    Oneal GroutMAHIMA Clarise Chacko, MD  [facility] [phone] (Monday-Friday 8 am - 5 pm) [phone] (afterhours)

## 2015-10-06 ENCOUNTER — Encounter: Payer: Self-pay | Admitting: Adult Health

## 2015-10-06 ENCOUNTER — Non-Acute Institutional Stay (SKILLED_NURSING_FACILITY): Payer: Medicare Other | Admitting: Adult Health

## 2015-10-06 DIAGNOSIS — F0390 Unspecified dementia without behavioral disturbance: Secondary | ICD-10-CM

## 2015-10-06 DIAGNOSIS — E46 Unspecified protein-calorie malnutrition: Secondary | ICD-10-CM | POA: Diagnosis not present

## 2015-10-06 DIAGNOSIS — K5900 Constipation, unspecified: Secondary | ICD-10-CM | POA: Diagnosis not present

## 2015-10-06 DIAGNOSIS — I10 Essential (primary) hypertension: Secondary | ICD-10-CM

## 2015-10-06 NOTE — Progress Notes (Signed)
Patient ID: Denise Lynn, female   DOB: [birth year], 83 y.o.   MRN: [id number]    DATE:  10/06/15  MRN:  [id number]  BIRTHDAY: Nov 24, 1918  Facility:  Nursing Home Location:  [facility]  Nursing Home Room Number: 1005-1  LEVEL OF CARE:  SNF (31)  Contact Information    Name Relation Home Work Mobile   Heydt,Bill Son [phone]     Ewing,Carol Daughter   [phone]       Code Status History    Date Active Date Inactive Code Status Order ID Comments User Context   04/26/2012  1:49 PM 04/29/2012  2:26 PM Full Code [id number]  Zerita Boers, RN Inpatient   04/26/2012 10:42 AM 04/26/2012  1:49 PM Full Code [id number]  Catarina Hartshorn, MD ED    Advance Directive Documentation        Most Recent Value   Type of Advance Directive  Out of facility DNR (pink MOST or yellow form)   Pre-existing out of facility DNR order (yellow form or pink MOST form)     "MOST" Form in Place?         Chief Complaint  Patient presents with  . Medical Management of Chronic Issues    HISTORY OF PRESENT ILLNESS:  This is a 82 year old female who is being seen for a routine visit. She is a long-term resident of [facility]. She is for comfort measures only and no hospitalizations. She has been stable for the past month.   PAST MEDICAL HISTORY:  Past Medical History  Diagnosis Date  . Hypercholesteremia   . Dementia without behavioral disturbance   . Protein calorie malnutrition (HCC)   . Chronic constipation   . HLD (hyperlipidemia)   . Benign essential hypertension   . Protein calorie malnutrition (HCC)      CURRENT MEDICATIONS: Reviewed  Patient's Medications  New Prescriptions   No medications on file  Previous Medications   BETA CAROTENE W/MINERALS (OCUVITE) TABLET    Take 1 tablet by mouth daily.   OXYGEN    Inhale 2 L/min into the lungs as needed.    PROTEIN (PROCEL PO)    Take 1 scoop by mouth 2 (two) times daily.    SENNOSIDES-DOCUSATE SODIUM (SENOKOT-S)  8.6-50 MG TABLET    Take 1 tablet by mouth at bedtime. For constipation   UNABLE TO FIND    Med Name: MedPass 2.0, 120 mL QID PO for nutritional support   ZINC OXIDE (SECURA EXTRA PROTECTIVE) 30.6 % CREA    Apply topically daily. Apply between buttocks as needed after each brief change  Modified Medications   No medications on file  Discontinued Medications   MULTIPLE VITAMINS-MINERALS (PRESERVISION/LUTEIN PO)    Take 1 capsule by mouth 2 (two) times daily.     Allergies  Allergen Reactions  . Demerol [Meperidine] Other (See Comments)    agitation  . Lodine [Etodolac] Other (See Comments)    Mouth blisters  . Tylenol [Acetaminophen] Other (See Comments)    dizziness     REVIEW OF SYSTEMS: unable to obtain due to advanced dementia  PHYSICAL EXAMINATION  GENERAL APPEARANCE:  In no acute distress. Normal body habitus SKIN:  Skin is warm and dry.  HEAD: Normal in size and contour. No evidence of trauma EYES: Lids open and close normally. No blepharitis, entropion or ectropion. PERRL. Conjunctivae are clear and sclerae are white. Lenses are without opacity EARS: Pinnae are normal. Hard of hearing MOUTH and THROAT: Lips  are without lesions. Oral mucosa is moist and without lesions. Tongue is normal in shape, size, and color and without lesions NECK: supple, trachea midline, no neck masses, no thyroid tenderness, no thyromegaly LYMPHATICS: no LAN in the neck, no supraclavicular LAN RESPIRATORY: breathing is even & unlabored, BS CTAB CARDIAC: RRR, no murmur,no extra heart sounds, no edema GI: abdomen soft, normal BS, no masses, no tenderness, no hepatomegaly, no splenomegaly EXTREMITIES:  Able to move X 4 extremities; bilateral hand fingers are contracted and uses splint; BLE generalized weakness PSYCHIATRIC: Alert and oriented to person and confused to time and place. Affect and behavior are appropriate  LABS/RADIOLOGY: Patient is for comfort measures  only    ASSESSMENT/PLAN:  Dementia - advanced; continue supportive care; continue scoop mattress to prevent falls  Constipation - continue senna S1 tab by mouth daily at bedtime  Protein calorie malnutrition - continue Procel 1 scoop by mouth twice a day, Med pass 120 ml QID  Hypertension - well-controlled without medication     Goals of care:  Long-term care    Kenard GowerMonina Medina-Vargas, NP [facility] [phone]

## 2015-11-05 ENCOUNTER — Non-Acute Institutional Stay (SKILLED_NURSING_FACILITY): Payer: Medicare Other | Admitting: Adult Health

## 2015-11-05 ENCOUNTER — Encounter: Payer: Self-pay | Admitting: Adult Health

## 2015-11-05 DIAGNOSIS — K5900 Constipation, unspecified: Secondary | ICD-10-CM | POA: Diagnosis not present

## 2015-11-05 DIAGNOSIS — E46 Unspecified protein-calorie malnutrition: Secondary | ICD-10-CM

## 2015-11-05 DIAGNOSIS — I10 Essential (primary) hypertension: Secondary | ICD-10-CM

## 2015-11-05 DIAGNOSIS — F0390 Unspecified dementia without behavioral disturbance: Secondary | ICD-10-CM | POA: Diagnosis not present

## 2015-11-05 DIAGNOSIS — M24549 Contracture, unspecified hand: Secondary | ICD-10-CM

## 2015-11-05 NOTE — Progress Notes (Signed)
Patient ID: Denise Lynn, female   DOB: [birth year], 82 y.o.   MRN: [id number]    DATE:    11/05/15  MRN:  [id number]  BIRTHDAY: [birth year]  Facility:  Nursing Home Location:  [facility]  Nursing Home Room Number: 1005-1  LEVEL OF CARE:  SNF (31)  Contact Information    Name Relation Home Work Mobile   Wooldridge,Bill Son [phone]     Ewing,Carol Daughter   [phone]       Code Status History    Date Active Date Inactive Code Status Order ID Comments User Context   04/26/2012  1:49 PM 04/29/2012  2:26 PM Full Code [id number]  Zerita BoersLonnie R Whitaker, RN Inpatient   04/26/2012 10:42 AM 04/26/2012  1:49 PM Full Code [id number]  Catarina Hartshornavid Tat, MD ED    Advance Directive Documentation        Most Recent Value   Type of Advance Directive  Out of facility DNR (pink MOST or yellow form)   Pre-existing out of facility DNR order (yellow form or pink MOST form)     "MOST" Form in Place?         Chief Complaint  Patient presents with  . Medical Management of Chronic Issues    HISTORY OF PRESENT ILLNESS:  This is a 82 year old female who is being seen for a routine visit. [facility] is [facility]. She is for comfort measures only and no hospitalizations. She was seen by OT and transitioned to RNP for therapeutic exercises and orthotic management. Bilateral palmar guards will be taken off by nurses @ HS and RNP to donn in AM.   PAST MEDICAL HISTORY:  Past Medical History  Diagnosis Date  . Hypercholesteremia   . Dementia without behavioral disturbance   . Protein calorie malnutrition (HCC)   . Chronic constipation   . HLD (hyperlipidemia)   . Benign essential hypertension   . Protein calorie malnutrition (HCC)      CURRENT MEDICATIONS: Reviewed  Patient's Medications  New Prescriptions   No medications on file  Previous Medications   BETA CAROTENE W/MINERALS (OCUVITE) TABLET    Take 1 tablet by mouth daily.   OXYGEN    Inhale 2 L/min into  the lungs as needed.    PROTEIN (PROCEL PO)    Take 1 scoop by mouth 2 (two) times daily.    SENNOSIDES-DOCUSATE SODIUM (SENOKOT-S) 8.6-50 MG TABLET    Take 1 tablet by mouth at bedtime. For constipation   UNABLE TO FIND    Med Name: MedPass 2.0, 120 mL QID PO for nutritional support   ZINC OXIDE (SECURA EXTRA PROTECTIVE) 30.6 % CREA    Apply topically daily. Apply between buttocks as needed after each brief change  Modified Medications   No medications on file  Discontinued Medications   No medications on file     Allergies  Allergen Reactions  . Demerol [Meperidine] Other (See Comments)    agitation  . Lodine [Etodolac] Other (See Comments)    Mouth blisters  . Tylenol [Acetaminophen] Other (See Comments)    dizziness     REVIEW OF SYSTEMS: unable to obtain due to advanced dementia  PHYSICAL EXAMINATION  GENERAL APPEARANCE:  In no acute distress. Normal body habitus SKIN:  Skin is warm and dry.  HEAD: Normal in size and contour. No evidence of trauma EYES: Lids open and close normally. No blepharitis, entropion or ectropion. PERRL. Conjunctivae are clear and sclerae are white. Lenses  are without opacity EARS: Pinnae are normal. Hard of hearing MOUTH and THROAT: Lips are without lesions. Oral mucosa is moist and without lesions. Tongue is normal in shape, size, and color and without lesions NECK: supple, trachea midline, no neck masses, no thyroid tenderness, no thyromegaly LYMPHATICS: no LAN in the neck, no supraclavicular LAN RESPIRATORY: breathing is even & unlabored, BS CTAB CARDIAC: RRR, no murmur,no extra heart sounds, no edema GI: abdomen soft, normal BS, no masses, no tenderness, no hepatomegaly, no splenomegaly EXTREMITIES:  Able to move X 4 extremities; bilateral hand fingers are contracted and uses bilateral palmar guards; BLE generalized weakness PSYCHIATRIC: Alert and oriented to person and confused to time and place. Affect and behavior are  appropriate  LABS/RADIOLOGY: Patient is for comfort measures only    ASSESSMENT/PLAN:  Contracture of fingers - continue bilateral palmar guards in AM and off @ HS; continue RNP  Dementia - advanced; continue supportive care; continue scoop mattress to prevent falls; fall precaution  Constipation - continue senna S1 tab by mouth daily at bedtime  Protein calorie malnutrition - continue Procel 1 scoop by mouth twice a day, Med pass 120 ml QID  Hypertension - well-controlled without medication     Goals of care:  Long-term care    Kenard Gower, NP [facility] [phone]

## 2015-12-03 ENCOUNTER — Non-Acute Institutional Stay (SKILLED_NURSING_FACILITY): Payer: Medicare Other | Admitting: Adult Health

## 2015-12-03 ENCOUNTER — Encounter: Payer: Self-pay | Admitting: Adult Health

## 2015-12-03 DIAGNOSIS — K5900 Constipation, unspecified: Secondary | ICD-10-CM

## 2015-12-03 DIAGNOSIS — M24549 Contracture, unspecified hand: Secondary | ICD-10-CM

## 2015-12-03 DIAGNOSIS — E46 Unspecified protein-calorie malnutrition: Secondary | ICD-10-CM

## 2015-12-03 DIAGNOSIS — F0390 Unspecified dementia without behavioral disturbance: Secondary | ICD-10-CM | POA: Diagnosis not present

## 2015-12-03 DIAGNOSIS — I10 Essential (primary) hypertension: Secondary | ICD-10-CM

## 2015-12-03 NOTE — Progress Notes (Signed)
Patient ID: Denise LauthJeanne D Moilanen, female   DOB: 1918-11-01, 82 y.o.   MRN: [id number]    DATE:    12/03/15  MRN:  [id number]  BIRTHDAY: 1918-11-01  Facility:  Nursing Home Location:  [facility]  Nursing Home Room Number: 1005-A  LEVEL OF CARE:  SNF (31)  Contact Information    Name Relation Home Work Mobile   Marcantonio,Bill Son [phone]     Ewing,Carol Daughter   [phone]       Code Status History    Date Active Date Inactive Code Status Order ID Comments User Context   04/26/2012  1:49 PM 04/29/2012  2:26 PM Full Code [id number]  Zerita BoersLonnie R Whitaker, RN Inpatient   04/26/2012 10:42 AM 04/26/2012  1:49 PM Full Code [id number]  Catarina Hartshornavid Tat, MD ED    Advance Directive Documentation        Most Recent Value   Type of Advance Directive  Out of facility DNR (pink MOST or yellow form)   Pre-existing out of facility DNR order (yellow form or pink MOST form)     "MOST" Form in Place?         Chief Complaint  Patient presents with  . Medical Management of Chronic Issues    HISTORY OF PRESENT ILLNESS:  This is a 82 year old female who is being seen for a routine visit. She is a [facility]. She is for comfort measures only and no hospitalizations. BP review has been stable - 120/74, 108/60, 132/75, 128/74.    PAST MEDICAL HISTORY:  Past Medical History  Diagnosis Date  . Hypercholesteremia   . Dementia without behavioral disturbance   . Protein calorie malnutrition (HCC)   . Chronic constipation   . HLD (hyperlipidemia)   . Benign essential hypertension   . Protein calorie malnutrition (HCC)      CURRENT MEDICATIONS: Reviewed  Patient's Medications  New Prescriptions   No medications on file  Previous Medications   BETA CAROTENE W/MINERALS (OCUVITE) TABLET    Take 1 tablet by mouth daily.   OXYGEN    Inhale 2 L/min into the lungs as needed.    PROTEIN (PROCEL PO)    Take 1 scoop by mouth 2 (two) times daily.    SENNOSIDES-DOCUSATE  SODIUM (SENOKOT-S) 8.6-50 MG TABLET    Take 1 tablet by mouth at bedtime. For constipation   UNABLE TO FIND    Med Name: MedPass 2.0, 120 mL QID PO for nutritional support   ZINC OXIDE (SECURA EXTRA PROTECTIVE) 30.6 % CREA    Apply topically daily. Apply between buttocks as needed after each brief change  Modified Medications   No medications on file  Discontinued Medications   No medications on file     Allergies  Allergen Reactions  . Demerol [Meperidine] Other (See Comments)    agitation  . Lodine [Etodolac] Other (See Comments)    Mouth blisters  . Tylenol [Acetaminophen] Other (See Comments)    dizziness     REVIEW OF SYSTEMS: unable to obtain due to advanced dementia  PHYSICAL EXAMINATION  GENERAL APPEARANCE:  In no acute distress. SKIN:  Skin is warm and dry.  HEAD: Normal in size and contour. No evidence of trauma EYES: Lids open and close normally. No blepharitis, entropion or ectropion. PERRL. Conjunctivae are clear and sclerae are white. Lenses are without opacity EARS: Pinnae are normal. Hard of hearing MOUTH and THROAT: Lips are without lesions. Oral mucosa is moist and without lesions.  Tongue is normal in shape, size, and color and without lesions NECK: supple, trachea midline, no neck masses, no thyroid tenderness, no thyromegaly LYMPHATICS: no LAN in the neck, no supraclavicular LAN RESPIRATORY: breathing is even & unlabored, BS CTAB CARDIAC: RRR, no murmur,no extra heart sounds, no edema GI: abdomen soft, normal BS, no masses, no tenderness, no hepatomegaly, no splenomegaly EXTREMITIES:  Able to move X 4 extremities; bilateral hand fingers are contracted and uses bilateral palmar guards; BLE generalized weakness PSYCHIATRIC: Alert and oriented to person and confused to time and place. Affect and behavior are appropriate  LABS/RADIOLOGY: Patient is for comfort measures only    ASSESSMENT/PLAN:   Protein calorie malnutrition - continue Procel 1 scoop by  mouth twice a day, Med pass 120 ml QID  Contracture of fingers - continue bilateral palmar guards in AM and off @ HS; RNP to donn bilateral palmar in AM and charge nurse to remove @ HS  Dementia - advanced; continue supportive care; continue scoop mattress to prevent falls; fall precaution  Constipation - continue senna S1 tab by mouth daily at bedtime  Hypertension - well-controlled without medication     Goals of care:  Long-term care    Kenard GowerMonina Medina-Vargas, NP [facility] [phone]

## 2015-12-11 LAB — BASIC METABOLIC PANEL
BUN: 17 mg/dL (ref 4–21)
CREATININE: 0.7 mg/dL (ref 0.5–1.1)
Glucose: 123 mg/dL
POTASSIUM: 3.9 mmol/L (ref 3.4–5.3)
Sodium: 141 mmol/L (ref 137–147)

## 2015-12-11 LAB — CBC AND DIFFERENTIAL
HCT: 40 % (ref 36–46)
HEMOGLOBIN: 13.3 g/dL (ref 12.0–16.0)
Neutrophils Absolute: 6960 /uL
PLATELETS: 276 10*3/uL (ref 150–399)
WBC: 8.7 10^3/mL

## 2015-12-17 ENCOUNTER — Non-Acute Institutional Stay (SKILLED_NURSING_FACILITY): Payer: Medicare Other | Admitting: Adult Health

## 2015-12-17 ENCOUNTER — Encounter: Payer: Self-pay | Admitting: Adult Health

## 2015-12-17 DIAGNOSIS — N390 Urinary tract infection, site not specified: Secondary | ICD-10-CM

## 2015-12-17 NOTE — Progress Notes (Signed)
Patient ID: FRANCHON KETTERMAN, female   DOB: 15-Nov-1918, 82 y.o.   MRN: [id number]    DATE:    12/17/15  MRN:  [id number]  BIRTHDAY: [birth year]  Facility:  Nursing Home Location:  [facility]  Nursing Home Room Number: 1005-A  LEVEL OF CARE:  SNF (31)  Contact Information    Name Relation Home Work Mobile   Ciulla,Bill Son [phone]     Ewing,Carol Daughter   [phone]       Code Status History    Date Active Date Inactive Code Status Order ID Comments User Context   04/26/2012  1:49 PM 04/29/2012  2:26 PM Full Code [id number]  Zerita Boers, RN Inpatient   04/26/2012 10:42 AM 04/26/2012  1:49 PM Full Code [id number]  Catarina Hartshorn, MD ED    Advance Directive Documentation        Most Recent Value   Type of Advance Directive  Out of facility DNR (pink MOST or yellow form)   Pre-existing out of facility DNR order (yellow form or pink MOST form)     "MOST" Form in Place?         Chief Complaint  Patient presents with  . Acute Visit    UTI    HISTORY OF PRESENT ILLNESS:  This is a 82 year old female who had a urine culture showing > 100,00 CFU/ml Proteus mirabilis. No hematuria/fever noted.   PAST MEDICAL HISTORY:  Past Medical History  Diagnosis Date  . Hypercholesteremia   . Dementia without behavioral disturbance   . Protein calorie malnutrition (HCC)   . Chronic constipation   . HLD (hyperlipidemia)   . Benign essential hypertension   . Protein calorie malnutrition (HCC)      CURRENT MEDICATIONS: Reviewed  Patient's Medications  New Prescriptions   No medications on file  Previous Medications   BETA CAROTENE W/MINERALS (OCUVITE) TABLET    Take 1 tablet by mouth daily.   OXYGEN    Inhale 2 L/min into the lungs as needed.    PROTEIN (PROCEL PO)    Take 1 scoop by mouth 2 (two) times daily.    SENNOSIDES-DOCUSATE SODIUM (SENOKOT-S) 8.6-50 MG TABLET    Take 1 tablet by mouth at bedtime. For constipation   UNABLE TO FIND    Med Name:  MedPass 2.0, 120 mL QID PO for nutritional support   ZINC OXIDE (SECURA EXTRA PROTECTIVE) 30.6 % CREA    Apply topically daily. Apply between buttocks as needed after each brief change  Modified Medications   No medications on file  Discontinued Medications   No medications on file     Allergies  Allergen Reactions  . Demerol [Meperidine] Other (See Comments)    agitation  . Lodine [Etodolac] Other (See Comments)    Mouth blisters  . Tylenol [Acetaminophen] Other (See Comments)    dizziness     REVIEW OF SYSTEMS: unable to obtain due to advanced dementia  PHYSICAL EXAMINATION  GENERAL APPEARANCE:  In no acute distress. SKIN:  Skin is warm and dry.  HEAD: Normal in size and contour. No evidence of trauma EYES: Lids open and close normally. No blepharitis, entropion or ectropion. PERRL. Conjunctivae are clear and sclerae are white. Lenses are without opacity EARS: Pinnae are normal. Hard of hearing MOUTH and THROAT: Lips are without lesions. Oral mucosa is moist and without lesions. Tongue is normal in shape, size, and color and without lesions NECK: supple, trachea midline, no neck masses, no thyroid tenderness,  no thyromegaly LYMPHATICS: no LAN in the neck, no supraclavicular LAN RESPIRATORY: breathing is even & unlabored, BS CTAB CARDIAC: RRR, no murmur,no extra heart sounds, no edema GI: abdomen soft, normal BS, no masses, no tenderness, no hepatomegaly, no splenomegaly EXTREMITIES:  Able to move X 4 extremities; bilateral hand fingers are contracted and uses bilateral palmar guards; BLE generalized weakness PSYCHIATRIC: Alert and oriented to person and confused to time and place. Affect and behavior are appropriate  LABS/RADIOLOGY: 12/15/15   urine culture result > 100,000 CFU/mL Proteus mirabilis 12/11/15   WBC 8.7 hemoglobin 13.3 hematocrit 39.5 MCV 92.1 platelet 276 sodium 141  K3.9  glucose 123 BUN 17 creatinine 0.67 calcium 7.9  ASSESSMENT/PLAN:  UTI - start Bactrim  DS 1 tab PO BID X 7 days and Florastor 250 mg 1 capsule PO BID X 10 days   Kenard GowerMonina Medina-Vargas, NP [facility] [phone]

## 2015-12-30 ENCOUNTER — Encounter: Payer: Self-pay | Admitting: Adult Health

## 2015-12-30 ENCOUNTER — Non-Acute Institutional Stay (SKILLED_NURSING_FACILITY): Payer: Medicare Other | Admitting: Adult Health

## 2015-12-30 DIAGNOSIS — E46 Unspecified protein-calorie malnutrition: Secondary | ICD-10-CM

## 2015-12-30 DIAGNOSIS — F0390 Unspecified dementia without behavioral disturbance: Secondary | ICD-10-CM

## 2015-12-30 DIAGNOSIS — I10 Essential (primary) hypertension: Secondary | ICD-10-CM | POA: Diagnosis not present

## 2015-12-30 DIAGNOSIS — K5900 Constipation, unspecified: Secondary | ICD-10-CM

## 2015-12-30 DIAGNOSIS — M24549 Contracture, unspecified hand: Secondary | ICD-10-CM | POA: Diagnosis not present

## 2015-12-30 NOTE — Progress Notes (Signed)
Patient ID: Denise Lynn, female   DOB: [birth year], 82 y.o.   MRN: [id number]    DATE:    12/30/15  MRN:  [id number]  BIRTHDAY: 24-Aug-1918  Facility:  Nursing Home Location:  [facility]  Nursing Home Room Number: 1005-A  LEVEL OF CARE:  SNF (31)  Contact Information    Name Relation Home Work Mobile   Snethen,Bill Son [phone]     Ewing,Carol Daughter   [phone]       Code Status History    Date Active Date Inactive Code Status Order ID Comments User Context   04/26/2012  1:49 PM 04/29/2012  2:26 PM Full Code [id number]  Zerita Boers, RN Inpatient   04/26/2012 10:42 AM 04/26/2012  1:49 PM Full Code [id number]  Catarina Hartshorn, MD ED    Advance Directive Documentation        Most Recent Value   Type of Advance Directive  Out of facility DNR (pink MOST or yellow form)   Pre-existing out of facility DNR order (yellow form or pink MOST form)     "MOST" Form in Place?         Chief Complaint  Patient presents with  . Medical Management of Chronic Issues    HISTORY OF PRESENT ILLNESS:  This is a 82 year old female who is being seen for a routine visit. She is a long-term resident of [facility]. She is for comfort measures only and no hospitalizations. She was recently treated for UTI.    PAST MEDICAL HISTORY:  Past Medical History:  Diagnosis Date  . Benign essential hypertension   . Chronic constipation   . Dementia without behavioral disturbance   . HLD (hyperlipidemia)   . Hypercholesteremia   . Protein calorie malnutrition (HCC)   . Protein calorie malnutrition (HCC)      CURRENT MEDICATIONS: Reviewed  Patient's Medications  New Prescriptions   No medications on file  Previous Medications   BETA CAROTENE W/MINERALS (OCUVITE) TABLET    Take 1 tablet by mouth daily.   OXYGEN    Inhale 2 L/min into the lungs as needed.    PROTEIN (PROCEL PO)    Take 1 scoop by mouth 2 (two) times daily.    SENNOSIDES-DOCUSATE SODIUM (SENOKOT-S)  8.6-50 MG TABLET    Take 1 tablet by mouth at bedtime. For constipation   UNABLE TO FIND    Med Name: MedPass 2.0, 120 mL QID PO for nutritional support   ZINC OXIDE (SECURA EXTRA PROTECTIVE) 30.6 % CREA    Apply topically daily. Apply between buttocks as needed after each brief change  Modified Medications   No medications on file  Discontinued Medications   No medications on file     Allergies  Allergen Reactions  . Demerol [Meperidine] Other (See Comments)    agitation  . Lodine [Etodolac] Other (See Comments)    Mouth blisters  . Tylenol [Acetaminophen] Other (See Comments)    dizziness     REVIEW OF SYSTEMS: unable to obtain due to advanced dementia  PHYSICAL EXAMINATION  GENERAL APPEARANCE:  In no acute distress.  SKIN:  Skin is warm and dry.  HEAD: Normal in size and contour. No evidence of trauma EYES: Lids open and close normally. No blepharitis, entropion or ectropion. PERRL. Conjunctivae are clear and sclerae are white. Lenses are without opacity EARS: Pinnae are normal. Hard of hearing MOUTH and THROAT: Lips are without lesions. Oral mucosa is moist and without lesions. Tongue is normal  in shape, size, and color and without lesions NECK: supple, trachea midline, no neck masses, no thyroid tenderness, no thyromegaly LYMPHATICS: no LAN in the neck, no supraclavicular LAN RESPIRATORY: breathing is even & unlabored, BS CTAB CARDIAC: RRR, no murmur,no extra heart sounds, no edema GI: abdomen soft, normal BS, no masses, no tenderness, no hepatomegaly, no splenomegaly EXTREMITIES:  Able to move X 4 extremities; bilateral hand fingers are contracted and uses bilateral palmar guards; BLE generalized weakness PSYCHIATRIC: Alert and oriented to person and confused to time and place. Affect and behavior are appropriate  LABS/RADIOLOGY: Labs Reviewed: 12/15/15  urine culture shows > 100,000 CFU/mL Proteus mirabilis 12/11/15  WBC 8.7 hemoglobin 13.3 hematocrit 39.5 MCV 92.1  platelet 276 sodium 141 potassium 3.9 CO2 26 glucose 123 BUN 17 creatinine 0.67 calcium 7.9 ;chest x-ray shows no pulmonary infiltrate   ASSESSMENT/PLAN:   Protein calorie malnutrition - continue Procel 1 scoop by mouth twice a day, Med pass 120 ml QID  Contracture of fingers - continue bilateral palmar guards in AM and off @ HS; RNP to donn bilateral palmar in AM and charge nurse to remove @ HS  Dementia - advanced; continue supportive care; continue scoop mattress to prevent falls; fall precaution  Constipation - continue senna S 8.6-50 mg 1 tab by mouth daily at bedtime  Hypertension - well-controlled without medication     Goals of care:  Long-term care    Kenard Gower, NP [facility] [phone]

## 2016-02-06 ENCOUNTER — Non-Acute Institutional Stay (SKILLED_NURSING_FACILITY): Payer: Medicare Other | Admitting: Internal Medicine

## 2016-02-06 ENCOUNTER — Encounter: Payer: Self-pay | Admitting: Internal Medicine

## 2016-02-06 DIAGNOSIS — K5909 Other constipation: Secondary | ICD-10-CM

## 2016-02-06 DIAGNOSIS — E785 Hyperlipidemia, unspecified: Secondary | ICD-10-CM

## 2016-02-06 DIAGNOSIS — K5900 Constipation, unspecified: Secondary | ICD-10-CM | POA: Diagnosis not present

## 2016-02-06 DIAGNOSIS — F0390 Unspecified dementia without behavioral disturbance: Secondary | ICD-10-CM | POA: Diagnosis not present

## 2016-02-06 DIAGNOSIS — I10 Essential (primary) hypertension: Secondary | ICD-10-CM | POA: Diagnosis not present

## 2016-02-06 DIAGNOSIS — E43 Unspecified severe protein-calorie malnutrition: Secondary | ICD-10-CM

## 2016-02-06 NOTE — Progress Notes (Signed)
Patient ID: Denise Lynn, female   DOB: May 07, 1919, 82 y.o.   MRN: [id number]      [facility]  Chief Complaint  Patient presents with  . Medical Management of Chronic Issues    Routine Visit   Allergies  Allergen Reactions  . Demerol [Meperidine] Other (See Comments)    agitation  . Lodine [Etodolac] Other (See Comments)    Mouth blisters  . Tylenol [Acetaminophen] Other (See Comments)    dizziness   Code status DNR  Advanced Directives 12/30/2015  Does patient have an advance directive? Yes  Type of Advance Directive Out of facility DNR (pink MOST or yellow form)  Does patient want to make changes to advanced directive? No - Patient declined  Copy of advanced directive(s) in chart? Yes  Pre-existing out of facility DNR order (yellow form or pink MOST form) -     HPI 82 y/o female patient is seen today for routine visit. She is sitting on her broda chair. She has been at her baseline. No recent fall reported. No skin concerns. She is not participating in HPI and ROS. No new concern from nursing staff.   Review of Systems  Unable to obtain    Past Medical History:  Diagnosis Date  . Benign essential hypertension   . Chronic constipation   . Dementia without behavioral disturbance   . HLD (hyperlipidemia)   . Hypercholesteremia   . Protein calorie malnutrition (HCC)   . Protein calorie malnutrition (HCC)       Medication List       Accurate as of 02/06/16 12:10 PM. Always use your most recent med list.          beta carotene w/minerals tablet Take 1 tablet by mouth daily.   PROCEL PO Take 1 scoop by mouth 2 (two) times daily.   sennosides-docusate sodium 8.6-50 MG tablet Commonly known as:  SENOKOT-S Take 1 tablet by mouth at bedtime. For constipation   UNABLE TO FIND Med Name: MedPass 2.0, 120 mL QID PO for nutritional support      Physical exam BP 112/64   Pulse 65   Temp 97.8 F (36.6 C) (Oral)   Resp 16    Ht 5\' 5"  (1.651 m)   Wt 105 lb 3.2 oz (47.7 kg)   SpO2 95%   BMI 17.51 kg/m      Wt Readings from Last 3 Encounters:  02/06/16 105 lb 3.2 oz (47.7 kg)  12/30/15 108 lb (49 kg)  12/17/15 110 lb 6.4 oz (50.1 kg)   General- elderly female, frail, thin built, in no acute distress Head- atraumatic, normocephalic Eyes- PERRLA, EOMI, no pallor, no icterus, wears glasses Neck- no lymphadenopathy Cardiovascular- normal s1,s2, no murmur, no leg edema Respiratory- bilateral clear to auscultation, no wheeze, no rhonchi, no crackles Abdomen- bowel sounds present, soft, non tender Musculoskeletal- able to move all 4 extremities, on wheelchair, has to be propelled by others, tries to self propel by using her feet, has arthritis changes of her fingers, has soft splint to her hands, kyphosis present Neurological- alert and oriented to self only  Labs CBC Latest Ref Rng & Units 12/11/2015 11/08/2014 08/19/2014  WBC 10:3/mL 8.7 7.0 7.3  Hemoglobin 12.0 - 16.0 g/dL 41.313.3 24.413.1 01.013.6  Hematocrit 36 - 46 % 40 38 40  Platelets 150 - 399 K/L 276 267 261   CMP Latest Ref Rng & Units 12/11/2015 11/08/2014 08/19/2014  Glucose 65 - 99 mg/dL - - -  BUN 4 - 21 mg/dL 17 19 17   Creatinine 0.5 - 1.1 mg/dL 0.7 0.6 0.6  Sodium 782 - 147 mmol/L 141 144 142  Potassium 3.4 - 5.3 mmol/L 3.9 3.7 3.8  Chloride 98 - 107 mmol/L - - -  CO2 21 - 32 mmol/L - - -  Calcium 8.5 - 10.1 mg/dL - - -  Total Protein 6.0 - 8.3 g/dL - - -  Total Bilirubin 0.3 - 1.2 mg/dL - - -  Alkaline Phos 25 - 125 U/L - - 61  AST 13 - 35 U/L - - 15  ALT 7 - 35 U/L - - 16   Lab Results  Component Value Date   TSH 2.73 02/05/2015   Lipid Panel     Component Value Date/Time   CHOL 155 11/08/2014   TRIG 316 (A) 11/08/2014   HDL 27 (A) 11/08/2014   LDLCALC 65 11/08/2014      Assessment/plan  Protein calorie malnutrition Wt Readings from Last 3 Encounters:  02/06/16 105 lb 3.2 oz (47.7 kg)  12/30/15 108 lb (49 kg)  12/17/15 110 lb  6.4 oz (50.1 kg)   Continues to lose weight. decline anticipated with her dementia. Continue procel supplement, medpass and MVI.   Advanced dementia No behavioral disturbance. Continue supportive care. Continue mechanical soft diet. Fall precautions and pressure ulcer prophylaxis.   Constipation Stable, continue senokot s  Hyperlipidemia Off meds, monitor clinically  HTN Overall stable BP reading, off all meds, monitor   Oneal Grout, MD Internal Medicine [facility] [address] [place], [place] [address] Cell Phone (Monday-Friday 8 am - 5 pm): [phone] On Call: [phone] and follow prompts after 5 pm and on weekends Office Phone: [phone] Office Fax: [phone]

## 2016-03-04 ENCOUNTER — Non-Acute Institutional Stay (SKILLED_NURSING_FACILITY): Payer: Medicare Other | Admitting: Adult Health

## 2016-03-04 ENCOUNTER — Encounter: Payer: Self-pay | Admitting: Adult Health

## 2016-03-04 DIAGNOSIS — E43 Unspecified severe protein-calorie malnutrition: Secondary | ICD-10-CM

## 2016-03-04 DIAGNOSIS — F0150 Vascular dementia without behavioral disturbance: Secondary | ICD-10-CM | POA: Diagnosis not present

## 2016-03-04 DIAGNOSIS — I10 Essential (primary) hypertension: Secondary | ICD-10-CM | POA: Diagnosis not present

## 2016-03-04 DIAGNOSIS — K5909 Other constipation: Secondary | ICD-10-CM

## 2016-03-04 DIAGNOSIS — M24549 Contracture, unspecified hand: Secondary | ICD-10-CM

## 2016-03-04 NOTE — Progress Notes (Signed)
Patient ID: Denise Lynn, female   DOB: [birth year], 82 y.o.   MRN: [id number]    DATE:    03/04/16  MRN:  [id number]  BIRTHDAY: 01-16-1919  Facility:  Nursing Home Location:  [facility]  Nursing Home Room Number: 1005-A  LEVEL OF CARE:  SNF (31)  Contact Information    Name Relation Home Work Mobile   Aubry,Bill Son [phone]     Ewing,Carol Daughter   [phone]       Code Status History    Date Active Date Inactive Code Status Order ID Comments User Context   04/26/2012  1:49 PM 04/29/2012  2:26 PM Full Code [id number]  Zerita Boers, RN Inpatient   04/26/2012 10:42 AM 04/26/2012  1:49 PM Full Code [id number]  Catarina Hartshorn, MD ED    Advance Directive Documentation        Most Recent Value   Type of Advance Directive  Out of facility DNR (pink MOST or yellow form)   Pre-existing out of facility DNR order (yellow form or pink MOST form)     "MOST" Form in Place?         Chief Complaint  Patient presents with  . Medical Management of Chronic Issues    HISTORY OF PRESENT ILLNESS:  This is a 82 year old female who is being seen for a routine visit. She is a long-term resident of [facility]. She is for comfort measures only and no hospitalizations. BP reviewed are stable without medication - 108/62, 122/56 and 112/64.   PAST MEDICAL HISTORY:  Past Medical History:  Diagnosis Date  . Benign essential hypertension   . Chronic constipation   . Dementia without behavioral disturbance   . HLD (hyperlipidemia)   . Hypercholesteremia   . Protein calorie malnutrition (HCC)   . Protein calorie malnutrition (HCC)      CURRENT MEDICATIONS: Reviewed  Patient's Medications  New Prescriptions   No medications on file  Previous Medications   BETA CAROTENE W/MINERALS (OCUVITE) TABLET    Take 1 tablet by mouth daily.   PROTEIN (PROCEL PO)    Take 1 scoop by mouth 2 (two) times daily.    SENNOSIDES-DOCUSATE SODIUM (SENOKOT-S) 8.6-50 MG TABLET     Take 1 tablet by mouth at bedtime. For constipation   UNABLE TO FIND    Med Name: MedPass 2.0, 120 mL QID PO for nutritional support  Modified Medications   No medications on file  Discontinued Medications   No medications on file     Allergies  Allergen Reactions  . Demerol [Meperidine] Other (See Comments)    agitation  . Lodine [Etodolac] Other (See Comments)    Mouth blisters  . Tylenol [Acetaminophen] Other (See Comments)    dizziness     REVIEW OF SYSTEMS: unable to obtain due to advanced dementia  PHYSICAL EXAMINATION  GENERAL APPEARANCE:  In no acute distress.  SKIN:  Skin is warm and dry.  HEAD: Normal in size and contour. No evidence of trauma EYES: Lids open and close normally. No blepharitis, entropion or ectropion. PERRL. Conjunctivae are clear and sclerae are white. Lenses are without opacity EARS: Pinnae are normal. Hard of hearing MOUTH and THROAT: Lips are without lesions. Oral mucosa is moist and without lesions. Tongue is normal in shape, size, and color and without lesions NECK: supple, trachea midline, no neck masses, no thyroid tenderness, no thyromegaly LYMPHATICS: no LAN in the neck, no supraclavicular LAN RESPIRATORY: breathing is even & unlabored, BS  CTAB CARDIAC: RRR, no murmur,no extra heart sounds, no edema GI: abdomen soft, normal BS, no masses, no tenderness, no hepatomegaly, no splenomegaly EXTREMITIES:  Able to move X 4 extremities; bilateral hand fingers are contracted and uses bilateral palmar guards; BLE generalized weakness PSYCHIATRIC: Alert and oriented to person and confused to time and place. Affect and behavior are appropriate  LABS/RADIOLOGY: Labs Reviewed: 12/15/15  urine culture shows > 100,000 CFU/mL Proteus mirabilis 12/11/15  WBC 8.7 hemoglobin 13.3 hematocrit 39.5 MCV 92.1 platelet 276 sodium 141 potassium 3.9 CO2 26 glucose 123 BUN 17 creatinine 0.67 calcium 7.9 ;chest x-ray shows no pulmonary  infiltrate   ASSESSMENT/PLAN:   Protein calorie malnutrition - continue Procel 1 scoop by mouth twice a day, Med pass 120 ml QID; latest weight 100.2 lbs, weight loss is expected due to advanced dementia  Contracture of fingers - continue bilateral palmar guards in AM and off @ HS; RNP to donn bilateral palmar in AM and charge nurse to remove @ HS  Dementia - advanced; continue supportive care; fall precaution  Constipation - continue senna S 8.6-50 mg 1 tab by mouth daily at bedtime  Hypertension - well-controlled without medication     Goals of care:  Long-term care    Kenard GowerMonina Medina-Vargas, NP [facility] [phone]

## 2016-03-08 LAB — LIPID PANEL
Cholesterol: 142 mg/dL (ref 0–200)
HDL: 35 mg/dL (ref 35–70)
LDL CALC: 72 mg/dL
Triglycerides: 176 mg/dL — AB (ref 40–160)

## 2016-03-08 LAB — BASIC METABOLIC PANEL
BUN: 20 mg/dL (ref 4–21)
Creatinine: 0.9 mg/dL (ref 0.5–1.1)
GLUCOSE: 196 mg/dL
Potassium: 4.1 mmol/L (ref 3.4–5.3)
SODIUM: 141 mmol/L (ref 137–147)

## 2016-03-08 LAB — HEPATIC FUNCTION PANEL
ALK PHOS: 109 U/L (ref 25–125)
ALT: 12 U/L (ref 7–35)
AST: 11 U/L — AB (ref 13–35)
BILIRUBIN, TOTAL: 0.4 mg/dL

## 2016-03-08 LAB — HEMOGLOBIN A1C: Hemoglobin A1C: 9.8

## 2016-04-02 ENCOUNTER — Non-Acute Institutional Stay (SKILLED_NURSING_FACILITY): Payer: Medicare Other | Admitting: Adult Health

## 2016-04-02 ENCOUNTER — Encounter: Payer: Self-pay | Admitting: Adult Health

## 2016-04-02 DIAGNOSIS — M24549 Contracture, unspecified hand: Secondary | ICD-10-CM

## 2016-04-02 DIAGNOSIS — E43 Unspecified severe protein-calorie malnutrition: Secondary | ICD-10-CM | POA: Diagnosis not present

## 2016-04-02 DIAGNOSIS — F0150 Vascular dementia without behavioral disturbance: Secondary | ICD-10-CM

## 2016-04-02 DIAGNOSIS — K5900 Constipation, unspecified: Secondary | ICD-10-CM

## 2016-04-02 DIAGNOSIS — R627 Adult failure to thrive: Secondary | ICD-10-CM

## 2016-04-02 NOTE — Progress Notes (Signed)
Patient ID: Denise Lynn, female   DOB: Aug 22, 1918, 82 y.o.   MRN: [id number]    DATE:    04/02/16  MRN:  [id number]  BIRTHDAY: Aug 22, 1918  Facility:  Nursing Home Location:  [facility]  Nursing Home Room Number: 1005-A  LEVEL OF CARE:  SNF (31)  Contact Information    Name Relation Home Work Mobile   Weinert,Bill Son [phone]     Ewing,Carol Daughter   [phone]       Code Status History    Date Active Date Inactive Code Status Order ID Comments User Context   04/26/2012  1:49 PM 04/29/2012  2:26 PM Full Code [id number]  Zerita BoersLonnie R Whitaker, RN Inpatient   04/26/2012 10:42 AM 04/26/2012  1:49 PM Full Code [id number]  Catarina Hartshornavid Tat, MD ED    Advance Directive Documentation        Most Recent Value   Type of Advance Directive  Out of facility DNR (pink MOST or yellow form)   Pre-existing out of facility DNR order (yellow form or pink MOST form)     "MOST" Form in Place?         Chief Complaint  Patient presents with  . Medical Management of Chronic Issues    HISTORY OF PRESENT ILLNESS:  This is a 82 year old female who is being seen for a routine visit. [facility] is [facility]. She is for comfort measures only and no hospitalizations. She has gained 0.4 lbs in 1 month, latest weight 100.6 lbs. She continues to have failure to thrive due to advanced dementia.    PAST MEDICAL HISTORY:  Past Medical History:  Diagnosis Date  . Benign essential hypertension   . Chronic constipation   . Dementia without behavioral disturbance   . HLD (hyperlipidemia)   . Hypercholesteremia   . Protein calorie malnutrition (HCC)      CURRENT MEDICATIONS: Reviewed  Patient's Medications  New Prescriptions   No medications on file  Previous Medications   BETA CAROTENE W/MINERALS (OCUVITE) TABLET    Take 1 tablet by mouth daily.   PROTEIN (PROCEL PO)    Take 1 scoop by mouth 2 (two) times daily.    SENNOSIDES-DOCUSATE SODIUM (SENOKOT-S) 8.6-50  MG TABLET    Take 1 tablet by mouth at bedtime. For constipation   UNABLE TO FIND    Med Name: MedPass 2.0, 120 mL QID PO for nutritional support  Modified Medications   No medications on file  Discontinued Medications   No medications on file     Allergies  Allergen Reactions  . Demerol [Meperidine] Other (See Comments)    agitation  . Lodine [Etodolac] Other (See Comments)    Mouth blisters  . Tylenol [Acetaminophen] Other (See Comments)    dizziness     REVIEW OF SYSTEMS: unable to obtain due to advanced dementia  PHYSICAL EXAMINATION  GENERAL APPEARANCE:  In no acute distress.  SKIN:  Skin is warm and dry.  HEAD: Normal in size and contour. No evidence of trauma EYES: Lids open and close normally. No blepharitis, entropion or ectropion. PERRL. Conjunctivae are clear and sclerae are white. Lenses are without opacity EARS: Pinnae are normal. Hard of hearing MOUTH and THROAT: Lips are without lesions. Oral mucosa is moist and without lesions. Tongue is normal in shape, size, and color and without lesions NECK: supple, trachea midline, no neck masses, no thyroid tenderness, no thyromegaly LYMPHATICS: no LAN in the neck, no supraclavicular LAN RESPIRATORY:  breathing is even & unlabored, BS CTAB CARDIAC: RRR, no murmur,no extra heart sounds, no edema GI: abdomen soft, normal BS, no masses, no tenderness, no hepatomegaly, no splenomegaly EXTREMITIES:  Able to move X 4 extremities; bilateral hand fingers are contracted and uses bilateral palmar guards; BLE generalized weakness PSYCHIATRIC: Alert and oriented to person and confused to time and place. Affect and behavior are appropriate  LABS/RADIOLOGY: Labs Reviewed: 12/15/15  urine culture shows > 100,000 CFU/mL Proteus mirabilis 12/11/15  WBC 8.7 hemoglobin 13.3 hematocrit 39.5 MCV 92.1 platelet 276 sodium 141 potassium 3.9 CO2 26 glucose 123 BUN 17 creatinine 0.67 calcium 7.9 ;chest x-ray shows no pulmonary  infiltrate   ASSESSMENT/PLAN:   Failure to thrive - due to advanced dementia; continue total care/supportive care  Protein calorie malnutrition - continue Procel 1 scoop by mouth twice a day, Med pass 120 ml QID; latest weight 100.2 lbs, weight loss is expected due to advanced dementia  Contracture of fingers - continue bilateral palmar guards in AM and off @ HS; RNP to donn bilateral palmar in A M and charge nurse to remove @ HS  Dementia - advanced; continue supportive care; fall precaution  Constipation - continue senna S 8.6-50 mg 1 tab by mouth daily at bedtime     Goals of care:  Long-term care    Kenard GowerMonina Medina-Vargas, NP [facility] [phone]

## 2016-04-09 ENCOUNTER — Encounter: Payer: Self-pay | Admitting: Internal Medicine

## 2016-04-09 NOTE — Progress Notes (Signed)
Open in error

## 2016-05-04 ENCOUNTER — Non-Acute Institutional Stay (SKILLED_NURSING_FACILITY): Payer: Medicare Other | Admitting: Adult Health

## 2016-05-04 ENCOUNTER — Encounter: Payer: Self-pay | Admitting: Adult Health

## 2016-05-04 DIAGNOSIS — K5909 Other constipation: Secondary | ICD-10-CM | POA: Diagnosis not present

## 2016-05-04 DIAGNOSIS — F0150 Vascular dementia without behavioral disturbance: Secondary | ICD-10-CM

## 2016-05-04 DIAGNOSIS — E43 Unspecified severe protein-calorie malnutrition: Secondary | ICD-10-CM | POA: Diagnosis not present

## 2016-05-04 DIAGNOSIS — M24549 Contracture, unspecified hand: Secondary | ICD-10-CM | POA: Diagnosis not present

## 2016-05-04 DIAGNOSIS — R627 Adult failure to thrive: Secondary | ICD-10-CM | POA: Diagnosis not present

## 2016-05-04 DIAGNOSIS — L719 Rosacea, unspecified: Secondary | ICD-10-CM | POA: Diagnosis not present

## 2016-05-04 NOTE — Progress Notes (Signed)
Patient ID: Denise Lynn, female   DOB: January 23, 1919, 82 y.o.   MRN: [id number]    DATE:    05/04/16  MRN:  [id number]  BIRTHDAY: January 23, 1919  Facility:  Nursing Home Location:  [facility]  Nursing Home Room Number: 1005-A  LEVEL OF CARE:  SNF (31)  Contact Information    Name Relation Home Work Mobile   Reading,Bill Son [phone]     Ewing,Carol Daughter   [phone]       Code Status History    Date Active Date Inactive Code Status Order ID Comments User Context   04/26/2012  1:49 PM 04/29/2012  2:26 PM Full Code [id number]  Zerita BoersLonnie R Whitaker, RN Inpatient   04/26/2012 10:42 AM 04/26/2012  1:49 PM Full Code [id number]  Catarina Hartshornavid Tat, MD ED    Advance Directive Documentation        Most Recent Value   Type of Advance Directive  Out of facility DNR (pink MOST or yellow form)   Pre-existing out of facility DNR order (yellow form or pink MOST form)     "MOST" Form in Place?         Chief Complaint  Patient presents with  . Medical Management of Chronic Issues    HISTORY OF PRESENT ILLNESS:  This is a 82 year old female who is being seen for a routine visit. She is a long-[facility]. She is for comfort measures only and no hospitalizations. She was recently treated for rosacea with Metronidazole cream X 2 weeks. Rashes on face are now clear.   PAST MEDICAL HISTORY:  Past Medical History:  Diagnosis Date  . Benign essential hypertension   . Chronic constipation   . Dementia without behavioral disturbance   . HLD (hyperlipidemia)   . Hypercholesteremia   . Protein calorie malnutrition (HCC)      CURRENT MEDICATIONS: Reviewed  Patient's Medications  New Prescriptions   No medications on file  Previous Medications   BETA CAROTENE W/MINERALS (OCUVITE) TABLET    Take 1 tablet by mouth daily.   PROTEIN (PROCEL PO)    Take 1 scoop by mouth 2 (two) times daily.    SENNOSIDES-DOCUSATE SODIUM (SENOKOT-S) 8.6-50 MG TABLET    Take 1  tablet by mouth at bedtime. For constipation   UNABLE TO FIND    Med Name: MedPass 2.0, 120 mL QID PO for nutritional support  Modified Medications   No medications on file  Discontinued Medications   No medications on file     Allergies  Allergen Reactions  . Demerol [Meperidine] Other (See Comments)    agitation  . Lodine [Etodolac] Other (See Comments)    Mouth blisters  . Tylenol [Acetaminophen] Other (See Comments)    dizziness     REVIEW OF SYSTEMS: unable to obtain due to advanced dementia  PHYSICAL EXAMINATION  GENERAL APPEARANCE:  In no acute distress.  SKIN:  Skin is warm and dry.  HEAD: Normal in size and contour. No evidence of trauma EYES: Lids open and close normally. No blepharitis, entropion or ectropion. PERRL. Conjunctivae are clear and sclerae are white. Lenses are without opacity EARS: Pinnae are normal. Hard of hearing MOUTH and THROAT: Lips are without lesions. Oral mucosa is moist and without lesions. Tongue is normal in shape, size, and color and without lesions NECK: supple, trachea midline, no neck masses, no thyroid tenderness, no thyromegaly RESPIRATORY: breathing is even & unlabored, BS CTAB CARDIAC: RRR, no murmur,no extra heart sounds, no  edema GI: abdomen soft, normal BS, no masses, no tenderness, no hepatomegaly, no splenomegaly EXTREMITIES:  Able to move X 4 extremities; bilateral hand fingers are contracted and uses bilateral palmar guards; BLE generalized weakness PSYCHIATRIC: Alert and oriented to person and confused to time and place. Affect and behavior are appropriate  LABS/RADIOLOGY: Labs Reviewed: 12/15/15  urine culture shows > 100,000 CFU/mL Proteus mirabilis 12/11/15  WBC 8.7 hemoglobin 13.3 hematocrit 39.5 MCV 92.1 platelet 276 sodium 141 potassium 3.9 CO2 26 glucose 123 BUN 17 creatinine 0.67 calcium 7.9 ;chest x-ray shows no pulmonary infiltrate   ASSESSMENT/PLAN:  Rosacea - recently treated with Metronidazole cream X 2 weeks;  rashes are now clear  Adult failure to thrive - due to advanced dementia; continue total care/supportive care  Protein calorie malnutrition - continue Procel 1 scoop by mouth twice a day, Med pass 120 ml QID; latest weight 97.8 lbs, weight loss is expected due to advanced dementia  Contracture of fingers - continue bilateral palmar guards in AM and off @ HS; RNP to donn bilateral palmar in AM and charge nurse to remove @ HS  Dementia - advanced; continue supportive care; fall precaution  Constipation - continue senna S 8.6-50 mg 1 tab by mouth daily at bedtime     Goals of care:  Long-term care    Kenard GowerMonina Medina-Vargas, NP [facility] [phone]

## 2016-05-25 LAB — BASIC METABOLIC PANEL
BUN: 25 mg/dL — AB (ref 4–21)
Creatinine: 0.6 mg/dL (ref 0.5–1.1)
Glucose: 111 mg/dL
Potassium: 3.7 mmol/L (ref 3.4–5.3)
Sodium: 146 mmol/L (ref 137–147)

## 2016-06-02 ENCOUNTER — Non-Acute Institutional Stay (SKILLED_NURSING_FACILITY): Payer: Medicare Other | Admitting: Adult Health

## 2016-06-02 ENCOUNTER — Encounter: Payer: Self-pay | Admitting: Adult Health

## 2016-06-02 DIAGNOSIS — E43 Unspecified severe protein-calorie malnutrition: Secondary | ICD-10-CM | POA: Diagnosis not present

## 2016-06-02 DIAGNOSIS — K5909 Other constipation: Secondary | ICD-10-CM

## 2016-06-02 DIAGNOSIS — R627 Adult failure to thrive: Secondary | ICD-10-CM

## 2016-06-02 DIAGNOSIS — I10 Essential (primary) hypertension: Secondary | ICD-10-CM

## 2016-06-02 DIAGNOSIS — F0150 Vascular dementia without behavioral disturbance: Secondary | ICD-10-CM

## 2016-06-02 DIAGNOSIS — M24549 Contracture, unspecified hand: Secondary | ICD-10-CM | POA: Diagnosis not present

## 2016-06-02 NOTE — Progress Notes (Signed)
Patient ID: Myrtice LauthJeanne D Deutschman, female   DOB: 07/06/1918, 83 y.o.   MRN: [id number]    DATE:    06/02/16  MRN:  [id number]  BIRTHDAY: 07/06/1918  Facility:  Nursing Home Location:  [facility]  Nursing Home Room Number: 1005-A  LEVEL OF CARE:  SNF (31)  Contact Information    Name Relation Home Work Mobile   Lauf,Bill Son [phone]     Ewing,Carol Daughter   [phone]       Code Status History    Date Active Date Inactive Code Status Order ID Comments User Context   04/26/2012  1:49 PM 04/29/2012  2:26 PM Full Code [id number]  Zerita BoersLonnie R Whitaker, RN Inpatient   04/26/2012 10:42 AM 04/26/2012  1:49 PM Full Code [id number]  Catarina Hartshornavid Tat, MD ED    Advance Directive Documentation        Most Recent Value   Type of Advance Directive  Out of facility DNR (pink MOST or yellow form)   Pre-existing out of facility DNR order (yellow form or pink MOST form)     "MOST" Form in Place?         Chief Complaint  Patient presents with  . Medical Management of Chronic Issues    HISTORY OF PRESENT ILLNESS:  This is a 83 year old female who is being seen for a routine visit. She is a [facility]. She is for comfort measures only and no hospitalizations. BP reviewed - 127/88, 112/73, 150/80.  PAST MEDICAL HISTORY:  Past Medical History:  Diagnosis Date  . Benign essential hypertension   . Chronic constipation   . Dementia without behavioral disturbance   . HLD (hyperlipidemia)   . Hypercholesteremia   . Protein calorie malnutrition (HCC)      CURRENT MEDICATIONS: Reviewed  Patient's Medications  New Prescriptions   No medications on file  Previous Medications   BETA CAROTENE W/MINERALS (OCUVITE) TABLET    Take 1 tablet by mouth daily.   PROTEIN (PROCEL PO)    Take 1 scoop by mouth 2 (two) times daily.    SENNOSIDES-DOCUSATE SODIUM (SENOKOT-S) 8.6-50 MG TABLET    Take 1 tablet by mouth at bedtime. For constipation   UNABLE TO FIND    Med  Name: MedPass 2.0, 120 mL QID PO for nutritional support  Modified Medications   No medications on file  Discontinued Medications   No medications on file     Allergies  Allergen Reactions  . Demerol [Meperidine] Other (See Comments)    agitation  . Lodine [Etodolac] Other (See Comments)    Mouth blisters  . Tylenol [Acetaminophen] Other (See Comments)    dizziness     REVIEW OF SYSTEMS: unable to obtain due to advanced dementia  PHYSICAL EXAMINATION  GENERAL APPEARANCE:  In no acute distress.  SKIN:  Skin is warm and dry.  HEAD: Normal in size and contour. No evidence of trauma EYES: Lids open and close normally. No blepharitis, entropion or ectropion. PERRL. Conjunctivae are clear and sclerae are white. Lenses are without opacity EARS: Pinnae are normal. Hard of hearing MOUTH and THROAT: Lips are without lesions. Oral mucosa is moist and without lesions. Tongue is normal in shape, size, and color and without lesions NECK: supple, trachea midline, no neck masses, no thyroid tenderness, no thyromegaly RESPIRATORY: breathing is even & unlabored, BS CTAB CARDIAC: RRR, no murmur,no extra heart sounds, no edema GI: abdomen soft, normal BS, no masses, no tenderness, no hepatomegaly, no  splenomegaly EXTREMITIES:  Able to move X 4 extremities; bilateral hand fingers are contracted and uses bilateral palmar guards; BLE generalized weakness PSYCHIATRIC: Alert and oriented to person and confused to time and place. Affect and behavior are appropriate  LABS/RADIOLOGY: Labs Reviewed: 12/15/15  urine culture shows > 100,000 CFU/mL Proteus mirabilis 12/11/15  WBC 8.7 hemoglobin 13.3 hematocrit 39.5 MCV 92.1 platelet 276 sodium 141 potassium 3.9 CO2 26 glucose 123 BUN 17 creatinine 0.67 calcium 7.9 ;chest x-ray shows no pulmonary infiltrate   ASSESSMENT/PLAN:  Hypertension - on no medications; will monitor BP BID X 1 week  Dementia - advanced; continue supportive care; fall  precaution  Adult failure to thrive - due to advanced dementia; continue total care/supportive care  Protein calorie malnutrition - continue Procel 1 scoop by mouth twice a day, Med pass 120 ml QID  Contracture of fingers - continue bilateral palmar guards in AM and off @ HS; RNP to donn bilateral palmar in AM and charge nurse to remove @ HS  Constipation - continue senna S 8.6-50 mg 1 tab by mouth daily at bedtime     Goals of care:  Long-term care    Kenard Gower, NP [facility] [phone]

## 2016-06-04 ENCOUNTER — Encounter: Payer: Self-pay | Admitting: Internal Medicine

## 2016-06-04 ENCOUNTER — Non-Acute Institutional Stay (SKILLED_NURSING_FACILITY): Payer: Medicare Other | Admitting: Internal Medicine

## 2016-06-04 DIAGNOSIS — F0150 Vascular dementia without behavioral disturbance: Secondary | ICD-10-CM | POA: Diagnosis not present

## 2016-06-04 DIAGNOSIS — K5909 Other constipation: Secondary | ICD-10-CM | POA: Diagnosis not present

## 2016-06-04 DIAGNOSIS — E43 Unspecified severe protein-calorie malnutrition: Secondary | ICD-10-CM | POA: Diagnosis not present

## 2016-06-04 NOTE — Progress Notes (Signed)
Patient ID: Denise Lynn, female   DOB: [birth year], 83 y.o.   MRN: [id number]      [facility]  Chief Complaint  Patient presents with  . Medical Management of Chronic Issues    Routine Visit    Allergies  Allergen Reactions  . Demerol [Meperidine] Other (See Comments)    agitation  . Lodine [Etodolac] Other (See Comments)    Mouth blisters  . Tylenol [Acetaminophen] Other (See Comments)    dizziness   Code status DNR  Advanced Directives 06/02/2016  Does Patient Have a Medical Advance Directive? Yes  Type of Advance Directive Out of facility DNR (pink MOST or yellow form);Healthcare Power of [place];Living will  Does patient want to make changes to medical advance directive? No - Patient declined  Copy of Healthcare Power of Attorney in Chart? Yes  Pre-existing out of facility DNR order (yellow form or pink MOST form) -     HPI 83 y/o female patient is seen today for routine visit. She has been at her baseline per nursing staff. She does not participate in history of present illness and review of systems with her dementia. No fall has been reported. She needs complete assistance with eating. She is out of bed daily and has to be wheeled around on her wheelchair. She has been compliant with her medications.    Review of Systems  Unable to obtain    Past Medical History:  Diagnosis Date  . Benign essential hypertension   . Chronic constipation   . Dementia without behavioral disturbance   . HLD (hyperlipidemia)   . Hypercholesteremia   . Protein calorie malnutrition (HCC)     Allergies as of 06/04/2016      Reactions   Demerol [meperidine] Other (See Comments)   agitation   Lodine [etodolac] Other (See Comments)   Mouth blisters   Tylenol [acetaminophen] Other (See Comments)   dizziness      Medication List       Accurate as of 06/04/16  2:56 PM. Always use your most recent med list.          beta carotene w/minerals  tablet Take 1 tablet by mouth daily.   PROCEL PO Take 1 scoop by mouth 2 (two) times daily.   sennosides-docusate sodium 8.6-50 MG tablet Commonly known as:  SENOKOT-S Take 1 tablet by mouth at bedtime. For constipation   UNABLE TO FIND Med Name: MedPass 2.0, 120 mL QID PO for nutritional support      Physical exam BP 120/65   Pulse 95   Temp 97.8 F (36.6 C) (Oral)   Resp 18   Ht 5\' 5"  (1.651 m)   Wt 97 lb 12.8 oz (44.4 kg)   SpO2 97%   BMI 16.27 kg/m      Wt Readings from Last 3 Encounters:  06/04/16 97 lb 12.8 oz (44.4 kg)  06/02/16 102 lb 3.2 oz (46.4 kg)  05/04/16 102 lb 3.2 oz (46.4 kg)   General- elderly female, frail, thin built, in no acute distress Head- atraumatic, normocephalic, Missing teeth Eyes- PERRLA, EOMI, no pallor, no icterus, wears glasses Neck- no lymphadenopathy Cardiovascular- normal s1,s2, no murmur, no leg edema Respiratory- bilateral clear to auscultation, no wheeze, no rhonchi, no crackles Abdomen- bowel sounds present, soft, non tender Musculoskeletal- able to move all 4 extremities, on wheelchair, has to be propelled by others, has arthritis changes of her fingers, kyphosis present, contractured to her hands Neurological- alert and oriented  to self only a  Labs CBC Latest Ref Rng & Units 12/11/2015 11/08/2014 08/19/2014  WBC 10:3/mL 8.7 7.0 7.3  Hemoglobin 12.0 - 16.0 g/dL 40.913.3 81.113.1 91.413.6  Hematocrit 36 - 46 % 40 38 40  Platelets 150 - 399 K/L 276 267 261   CMP Latest Ref Rng & Units 03/08/2016 12/11/2015 11/08/2014  Glucose 65 - 99 mg/dL - - -  BUN 4 - 21 mg/dL 20 17 19   Creatinine 0.5 - 1.1 mg/dL 0.9 0.7 0.6  Sodium 782137 - 147 mmol/L 141 141 144  Potassium 3.4 - 5.3 mmol/L 4.1 3.9 3.7  Chloride 98 - 107 mmol/L - - -  CO2 21 - 32 mmol/L - - -  Calcium 8.5 - 10.1 mg/dL - - -  Total Protein 6.0 - 8.3 g/dL - - -  Total Bilirubin 0.3 - 1.2 mg/dL - - -  Alkaline Phos 25 - 125 U/L 109 - -  AST 13 - 35 U/L 11(A) - -  ALT 7 - 35 U/L 12 -  -   Lab Results  Component Value Date   TSH 2.73 02/05/2015   Lipid Panel     Component Value Date/Time   CHOL 142 03/08/2016   TRIG 176 (A) 03/08/2016   HDL 35 03/08/2016   LDLCALC 72 03/08/2016      Assessment/plan  Chronic constipation Continue senna 1 tablet daily at bedtime. Monitor  Severe protein calorie malnutrition Continue mechanical soft diet with fortified foods. Continue Procel and med Pass supplement. Continue multivitamin. Declined anticipated with her advanced dementia.   Advanced dementia No behavioral disturbance. Continue supportive care. Continue mechanical soft diet. Fall precautions and pressure ulcer prophylaxis.   Labs- cbc, bmp 06/07/16   Oneal GroutMAHIMA Dorma Altman, MD Internal Medicine [facility] [address] [place], [place] [address] Cell Phone (Monday-Friday 8 am - 5 pm): [phone] On Call: [phone] and follow prompts after 5 pm and on weekends Office Phone: [phone] Office Fax: [phone]

## 2016-06-07 LAB — BASIC METABOLIC PANEL
BUN: 19 mg/dL (ref 4–21)
CREATININE: 0.7 mg/dL (ref 0.5–1.1)
Glucose: 154 mg/dL
POTASSIUM: 4.2 mmol/L (ref 3.4–5.3)
SODIUM: 145 mmol/L (ref 137–147)

## 2016-06-07 LAB — HEPATIC FUNCTION PANEL
ALK PHOS: 62 U/L (ref 25–125)
ALT: 11 U/L (ref 7–35)
AST: 14 U/L (ref 13–35)
Bilirubin, Total: 0.5 mg/dL

## 2016-06-07 LAB — CBC AND DIFFERENTIAL
HCT: 42 % (ref 36–46)
Hemoglobin: 14 g/dL (ref 12.0–16.0)
Neutrophils Absolute: 6 /uL
Platelets: 248 10*3/uL (ref 150–399)
WBC: 8.6 10^3/mL

## 2016-07-07 ENCOUNTER — Non-Acute Institutional Stay (SKILLED_NURSING_FACILITY): Payer: Medicare Other | Admitting: Adult Health

## 2016-07-07 DIAGNOSIS — E43 Unspecified severe protein-calorie malnutrition: Secondary | ICD-10-CM

## 2016-07-07 DIAGNOSIS — E440 Moderate protein-calorie malnutrition: Secondary | ICD-10-CM

## 2016-07-07 DIAGNOSIS — I10 Essential (primary) hypertension: Secondary | ICD-10-CM

## 2016-07-07 DIAGNOSIS — M24549 Contracture, unspecified hand: Secondary | ICD-10-CM

## 2016-07-07 NOTE — Progress Notes (Addendum)
DATE:  07/07/2016   MRN:  [id number]  BIRTHDAY: 1918-08-14  Facility:  Nursing Home Location:  [facility]  Nursing Home Room Number: 1005-A  LEVEL OF CARE:  SNF [phone])  Contact Information    Name Relation Home Work Mobile   Moncrief,Bill Son [phone]     Ewing,Carol Daughter   [phone]       Code Status History    Date Active Date Inactive Code Status Order ID Comments User Context   04/26/2012  1:49 PM 04/29/2012  2:26 PM Full Code [id number]  Zerita Boers, RN Inpatient   04/26/2012 10:42 AM 04/26/2012  1:49 PM Full Code [id number]  Catarina Hartshorn, MD ED    Advance Directive Documentation   Flowsheet Row Most Recent Value  Type of Advance Directive  Out of facility DNR (pink MOST or yellow form), Healthcare Power of Attorney  Pre-existing out of facility DNR order (yellow form or pink MOST form)  No data  "MOST" Form in Place?  No data       Chief Complaint  Patient presents with  . Medical Management of Chronic Issues    HISTORY OF PRESENT ILLNESS:  This is a 89-YO female seen for a routine visit.  She is a long-term care resident at [facility]. She has contraction of bilateral hand fingers. She had OT treatments X 1 week. Orthotics were started on bilateral hands. Latest weight is 97.6 lbs Body mass index is 14.88 kg/m.Marland Kitchen She was recently started on magic cup BID and fortified foods with meals.   PAST MEDICAL HISTORY:  Past Medical History:  Diagnosis Date  . Benign essential hypertension   . Chronic constipation   . Dementia without behavioral disturbance   . HLD (hyperlipidemia)   . Hypercholesteremia   . Protein calorie malnutrition (HCC)      CURRENT MEDICATIONS: Reviewed  Patient's Medications  New Prescriptions   No medications on file  Previous Medications   BETA CAROTENE W/MINERALS (OCUVITE) TABLET    Take 1 tablet by mouth daily.   NUTRITIONAL SUPPLEMENTS (NUTRITIONAL SUPPLEMENT PO)    Take 1 each by  mouth 2 (two) times daily. Magic Cup   PROTEIN (PROCEL PO)    Take 1 scoop by mouth 2 (two) times daily.    SENNOSIDES-DOCUSATE SODIUM (SENOKOT-S) 8.6-50 MG TABLET    Take 1 tablet by mouth at bedtime. For constipation   UNABLE TO FIND    Med Name: MedPass 2.0, 120 mL QID PO for nutritional support  Modified Medications   No medications on file  Discontinued Medications   No medications on file     Allergies  Allergen Reactions  . Demerol [Meperidine] Other (See Comments)    agitation  . Lodine [Etodolac] Other (See Comments)    Mouth blisters  . Tylenol [Acetaminophen] Other (See Comments)    dizziness     REVIEW OF SYSTEMS:  Unable to obtain due to advanced dementia    PHYSICAL EXAMINATION  GENERAL APPEARANCE:  In no acute distress.  SKIN:  Skin is warm and dry.  HEAD: Normal in size and contour. No evidence of trauma EYES: Lids open and close normally. No blepharitis, entropion or ectropion. PERRL. Conjunctivae are clear and sclerae are white. Lenses are without opacity EARS: Pinnae are normal. Hard of hearing MOUTH and THROAT: Lips are without lesions. Oral mucosa is moist and without lesions. Tongue is normal in shape, size, and color and without lesions NECK: supple, trachea midline,  no neck masses, no thyroid tenderness, no thyromegaly LYMPHATICS: no LAN in the neck, no supraclavicular LAN RESPIRATORY: breathing is even & unlabored, BS CTAB CARDIAC: RRR, no murmur,no extra heart sounds, no edema GI: abdomen soft, normal BS, no masses, no tenderness, no hepatomegaly, no splenomegaly EXTREMITIES:  Able to move X 4 extremities; has generalized weakness, bilateral hand fingers are contracted PSYCHIATRIC: Disoriented to time, place and person. Affect and behavior are appropriate   LABS/RADIOLOGY: Labs reviewed: Basic Metabolic Panel:  Recent Labs  95/62/1307/13/17 03/08/16  NA 141 141  K 3.9 4.1  BUN 17 20  CREATININE 0.7 0.9   Liver Function Tests:  Recent Labs   03/08/16  AST 11*  ALT 12  ALKPHOS 109   CBC:  Recent Labs  12/11/15  WBC 8.7  NEUTROABS 6,960  HGB 13.3  HCT 40  PLT 276   Lipid Panel:  Recent Labs  03/08/16  HDL 35    ASSESSMENT/PLAN:  Hypertension - well-controlled without medications  Protein calorie malnutrition, severe - recently started on magic cup BID and Fortified foods to meals, continue Procel 1 scoop by mouth twice a day, Med pass 120 ml QID  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 Medina-Vargas - NP   [facility]  [phone]

## 2016-08-02 ENCOUNTER — Encounter: Payer: Self-pay | Admitting: Adult Health

## 2016-08-02 ENCOUNTER — Non-Acute Institutional Stay (SKILLED_NURSING_FACILITY): Payer: Medicare Other | Admitting: Adult Health

## 2016-08-02 DIAGNOSIS — R1310 Dysphagia, unspecified: Secondary | ICD-10-CM | POA: Diagnosis not present

## 2016-08-02 DIAGNOSIS — F0150 Vascular dementia without behavioral disturbance: Secondary | ICD-10-CM | POA: Diagnosis not present

## 2016-08-02 DIAGNOSIS — K5909 Other constipation: Secondary | ICD-10-CM | POA: Diagnosis not present

## 2016-08-02 NOTE — Progress Notes (Addendum)
Patient ID: Denise Lynn, female   DOB: Jun 11, 1918, 83 y.o.   MRN: [id number]    DATE:    08/02/16   MRN:  [id number]  BIRTHDAY: 08/24/1918  Facility:  Nursing Home Location:  [facility]  Nursing Home Room Number: 1005-A  LEVEL OF CARE:  SNF (31)  Contact Information    Name Relation Home Work Mobile   Denise Lynn,Denise Lynn Son [phone]     Ewing,Carol Daughter   [phone]       Code Status History    Date Active Date Inactive Code Status Order ID Comments User Context   04/26/2012  1:49 PM 04/29/2012  2:26 PM Full Code [id number]  Zerita Boers, RN Inpatient   04/26/2012 10:42 AM 04/26/2012  1:49 PM Full Code [id number]  Catarina Hartshorn, MD ED    Advance Directive Documentation   Flowsheet Row Most Recent Value  Type of Advance Directive  Out of facility DNR (pink MOST or yellow form), Healthcare Power of Attorney  Pre-existing out of facility DNR order (yellow form or pink MOST form)  No data  "MOST" Form in Place?  No data       Chief Complaint  Patient presents with  . Medical Management of Chronic Issues    HISTORY OF PRESENT ILLNESS:  This is a 83-YO female seen for a routine visit.  She is a long-term care resident at [facility]. She is currently having ST treatments for dysphagia and poor PO intake. Latest weight is 95.6 lbs Body mass index is 14.88 kg/m.    PAST MEDICAL HISTORY:  Past Medical History:  Diagnosis Date  . Benign essential hypertension   . Chronic constipation   . Dementia without behavioral disturbance   . HLD (hyperlipidemia)   . Hypercholesteremia   . Protein calorie malnutrition (HCC)      CURRENT MEDICATIONS: Reviewed  Patient's Medications  New Prescriptions   No medications on file  Previous Medications   BETA CAROTENE W/MINERALS (OCUVITE) TABLET    Take 1 tablet by mouth daily.   NUTRITIONAL SUPPLEMENTS (NUTRITIONAL SUPPLEMENT PO)    Take 1 each by mouth 2 (two) times daily. Magic Cup   PROTEIN (PROCEL PO)    Take 1 scoop by mouth 2 (two) times daily.    SENNOSIDES-DOCUSATE SODIUM (SENOKOT-S) 8.6-50 MG TABLET    Take 1 tablet by mouth at bedtime. For constipation   UNABLE TO FIND    Med Name: MedPass 2.0, 120 mL QID PO for nutritional support  Modified Medications   No medications on file  Discontinued Medications   No medications on file     Allergies  Allergen Reactions  . Demerol [Meperidine] Other (See Comments)    agitation  . Lodine [Etodolac] Other (See Comments)    Mouth blisters  . Tylenol [Acetaminophen] Other (See Comments)    dizziness     REVIEW OF SYSTEMS:  Unable to obtain due to advanced dementia    PHYSICAL EXAMINATION  GENERAL APPEARANCE:  In no acute distress.  SKIN:  Skin is warm and dry.  HEAD: Normal in size and contour. No evidence of trauma EYES: Lids open and close normally. No blepharitis, entropion or ectropion. PERRL. Conjunctivae are clear and sclerae are white. Lenses are without opacity EARS: Pinnae are normal. Hard of hearing MOUTH and THROAT: Lips are without lesions. Oral mucosa is moist and without lesions. Tongue is normal in shape, size, and color and without lesions NECK: supple, trachea midline, no neck  masses, no thyroid tenderness, no thyromegaly LYMPHATICS: no LAN in the neck, no supraclavicular LAN RESPIRATORY: breathing is even & unlabored, BS CTAB CARDIAC: RRR, no murmur,no extra heart sounds, no edema GI: abdomen soft, normal BS, no masses, no tenderness, no hepatomegaly, no splenomegaly EXTREMITIES:  Able to move X 4 extremities; has generalized weakness, bilateral hand fingers are contracted PSYCHIATRIC: Disoriented to time, place and person. Affect and behavior are appropriate   LABS/RADIOLOGY: Labs reviewed: Basic Metabolic Panel:  Recent Labs  95/28/4109/02/14 05/25/16 06/07/16  NA 141 146 145  K 4.1 3.7 4.2  BUN 20 25* 19  CREATININE 0.9 0.6 0.7   Liver Function Tests:  Recent Labs  03/08/16 06/07/16   AST 11* 14  ALT 12 11  ALKPHOS 109 62   CBC:  Recent Labs  12/11/15 06/07/16  WBC 8.7 8.6  NEUTROABS 6,960 6  HGB 13.3 14.0  HCT 40 42  PLT 276 248   Lipid Panel:  Recent Labs  03/08/16  HDL 35    ASSESSMENT/PLAN:  Dysphagia - recently started ST treatments for swallowing functions; aspiration precautions  Chronic constipation - continue Senna- S 8.6-50 mg 1 tab PO Q HS  Dementia without behavioral disturbance - continue supportive care; fall precautions    Goals of care:  Long-term care   Ayssa Bentivegna C. Medina-Vargas - NP   [facility]  [phone]

## 2016-09-03 ENCOUNTER — Encounter: Payer: Self-pay | Admitting: Adult Health

## 2016-09-03 ENCOUNTER — Non-Acute Institutional Stay (SKILLED_NURSING_FACILITY): Payer: Medicare Other | Admitting: Adult Health

## 2016-09-03 DIAGNOSIS — I10 Essential (primary) hypertension: Secondary | ICD-10-CM | POA: Diagnosis not present

## 2016-09-03 DIAGNOSIS — E440 Moderate protein-calorie malnutrition: Secondary | ICD-10-CM | POA: Diagnosis not present

## 2016-09-03 DIAGNOSIS — H9193 Unspecified hearing loss, bilateral: Secondary | ICD-10-CM

## 2016-09-03 NOTE — Progress Notes (Signed)
Patient ID: Denise Lynn, female   DOB: [birth year], 83 y.o.   MRN: [id number]    DATE:    09/03/16   MRN:  [id number]  BIRTHDAY: [birth year]  Facility:  Nursing Home Location:  [facility]  Nursing Home Room Number: 1005-A  LEVEL OF CARE:  SNF (31)  Contact Information    Name Relation Home Work Mobile   Neisen,Bill Son [phone]     Ewing,Carol Daughter   [phone]       Code Status History    Date Active Date Inactive Code Status Order ID Comments User Context   04/26/2012  1:49 PM 04/29/2012  2:26 PM Full Code [id number]  Zerita Boers, RN Inpatient   04/26/2012 10:42 AM 04/26/2012  1:49 PM Full Code [id number]  Catarina Hartshorn, MD ED    Advance Directive Documentation   Flowsheet Row Most Recent Value  Type of Advance Directive  Out of facility DNR (pink MOST or yellow form), Healthcare Power of Attorney  Pre-existing out of facility DNR order (yellow form or pink MOST form)  No data  "MOST" Form in Place?  No data       Chief Complaint  Patient presents with  . Medical Management of Chronic Issues    HISTORY OF PRESENT ILLNESS:  This is a 83-YO female seen for a routine visit.  She is a long-term care resident at [facility]. Hypertension is well-controlled without medications - 114/56, 130/64, 111/74, 97/54, 123/66. She was seen in the dining room and was verbally responsive. Noted not in any distress nor concerns from charge nurse.     PAST MEDICAL HISTORY:  Past Medical History:  Diagnosis Date  . Benign essential hypertension   . Chronic constipation   . Dementia without behavioral disturbance   . HLD (hyperlipidemia)   . Hypercholesteremia   . Protein calorie malnutrition (HCC)      CURRENT MEDICATIONS: Reviewed  Patient's Medications  New Prescriptions   No medications on file  Previous Medications   BETA CAROTENE W/MINERALS (OCUVITE) TABLET    Take 1 tablet by mouth daily.   NUTRITIONAL SUPPLEMENTS  (NUTRITIONAL SUPPLEMENT PO)    Take 1 each by mouth 2 (two) times daily. Magic Cup   PROTEIN (PROCEL PO)    Take 1 scoop by mouth 2 (two) times daily.    SENNOSIDES-DOCUSATE SODIUM (SENOKOT-S) 8.6-50 MG TABLET    Take 1 tablet by mouth at bedtime. For constipation   UNABLE TO FIND    Med Name: MedPass 2.0, 120 mL QID PO for nutritional support  Modified Medications   No medications on file  Discontinued Medications   No medications on file     Allergies  Allergen Reactions  . Demerol [Meperidine] Other (See Comments)    agitation  . Lodine [Etodolac] Other (See Comments)    Mouth blisters  . Tylenol [Acetaminophen] Other (See Comments)    dizziness     REVIEW OF SYSTEMS:  Unable to obtain due to advanced dementia    PHYSICAL EXAMINATION  GENERAL APPEARANCE:  In no acute distress.  SKIN:  Skin is warm and dry.  HEAD: Normal in size and contour. No evidence of trauma EYES: Lids open and close normally. No blepharitis, entropion or ectropion. PERRL. Conjunctivae are clear and sclerae are white. Lenses are without opacity EARS: Pinnae are normal. Hard of hearing MOUTH and THROAT: Lips are without lesions. Oral mucosa is moist and without lesions. Tongue is normal in shape, size,  and color and without lesions NECK: supple, trachea midline, no neck masses, no thyroid tenderness, no thyromegaly LYMPHATICS: no LAN in the neck, no supraclavicular LAN RESPIRATORY: breathing is even & unlabored, BS CTAB CARDIAC: RRR, no murmur,no extra heart sounds, no edema GI: abdomen soft, normal BS, no masses, no tenderness, no hepatomegaly, no splenomegaly EXTREMITIES:  Able to move X 4 extremities; has generalized weakness, bilateral hand fingers are contracted with splint PSYCHIATRIC: Disoriented to time, place and person. Affect and behavior are appropriate   LABS/RADIOLOGY: Labs reviewed: Basic Metabolic Panel:  Recent Labs  40/98/11 05/25/16 06/07/16  NA 141 146 145  K 4.1 3.7 4.2   BUN 20 25* 19  CREATININE 0.9 0.6 0.7   Liver Function Tests:  Recent Labs  03/08/16 06/07/16  AST 11* 14  ALT 12 11  ALKPHOS 109 62   CBC:  Recent Labs  12/11/15 06/07/16  WBC 8.7 8.6  NEUTROABS 6,960 6  HGB 13.3 14.0  HCT 40 42  PLT 276 248   Lipid Panel:  Recent Labs  03/08/16  HDL 35    ASSESSMENT/PLAN:  Protein-calorie malnutrition, severe -  Continue fortified foods 3X/day, procel 1 scoop BID, medpass 120 ml QID; check CMP and CBC  Hard of hearing - encourage staff to anticipate needs of resident; face resident  and use gestures during communication process  Hypertension - well-controlled without medications    Goals of care:  Long-term care   Jaivion Kingsley C. Medina-Vargas - NP   [facility]  [phone]

## 2016-09-06 LAB — CBC AND DIFFERENTIAL
HEMATOCRIT: 40 % (ref 36–46)
HEMOGLOBIN: 13 g/dL (ref 12.0–16.0)
Platelets: 234 10*3/uL (ref 150–399)
WBC: 8.4 10^3/mL

## 2016-09-06 LAB — BASIC METABOLIC PANEL
BUN: 24 mg/dL — AB (ref 4–21)
Creatinine: 0.7 mg/dL (ref 0.5–1.1)
Glucose: 101 mg/dL
POTASSIUM: 4.2 mmol/L (ref 3.4–5.3)
SODIUM: 144 mmol/L (ref 137–147)

## 2016-09-06 LAB — HEPATIC FUNCTION PANEL
ALK PHOS: 60 U/L (ref 25–125)
ALT: 12 U/L (ref 7–35)
AST: 11 U/L — AB (ref 13–35)
Bilirubin, Total: 0.3 mg/dL

## 2016-09-29 ENCOUNTER — Non-Acute Institutional Stay (SKILLED_NURSING_FACILITY): Payer: Medicare Other | Admitting: Adult Health

## 2016-09-29 ENCOUNTER — Encounter: Payer: Self-pay | Admitting: Adult Health

## 2016-09-29 DIAGNOSIS — K5909 Other constipation: Secondary | ICD-10-CM

## 2016-09-29 DIAGNOSIS — E43 Unspecified severe protein-calorie malnutrition: Secondary | ICD-10-CM | POA: Diagnosis not present

## 2016-09-29 DIAGNOSIS — I10 Essential (primary) hypertension: Secondary | ICD-10-CM

## 2016-09-29 DIAGNOSIS — M24549 Contracture, unspecified hand: Secondary | ICD-10-CM

## 2016-09-29 DIAGNOSIS — H9193 Unspecified hearing loss, bilateral: Secondary | ICD-10-CM

## 2016-09-29 DIAGNOSIS — F0150 Vascular dementia without behavioral disturbance: Secondary | ICD-10-CM | POA: Diagnosis not present

## 2016-09-29 NOTE — Progress Notes (Signed)
DATE:  09/29/2016   MRN:  [id number]  BIRTHDAY: 1919/05/11  Facility:  Nursing Home Location:  [facility]  Nursing Home Room Number: 1005-A  LEVEL OF CARE:  SNF (31)  Contact Information    Name Relation Home Work Mobile   Horn,Bill Son [phone]     Ewing,Carol Daughter   [phone]       Code Status History    Date Active Date Inactive Code Status Order ID Comments User Context   04/26/2012  1:49 PM 04/29/2012  2:26 PM Full Code [id number]  Zerita Boers, RN Inpatient   04/26/2012 10:42 AM 04/26/2012  1:49 PM Full Code [id number]  Catarina Hartshorn, MD ED    Advance Directive Documentation     Most Recent Value  Type of Advance Directive  Out of facility DNR (pink MOST or yellow form)  Pre-existing out of facility DNR order (yellow form or pink MOST form)  -  "MOST" Form in Place?  -       Chief Complaint  Patient presents with  . Medical Management of Chronic Issues    HISTORY OF PRESENT ILLNESS:  This is a 89-YO female seen for a routine visit.  She is a long-term care resident of [facility].    PAST MEDICAL HISTORY:  Past Medical History:  Diagnosis Date  . Benign essential hypertension   . Chronic constipation   . Dementia without behavioral disturbance   . HLD (hyperlipidemia)   . Hypercholesteremia   . Protein calorie malnutrition (HCC)      CURRENT MEDICATIONS: Reviewed  Patient's Medications  New Prescriptions   No medications on file  Previous Medications   BETA CAROTENE W/MINERALS (OCUVITE) TABLET    Take 1 tablet by mouth daily.   NUTRITIONAL SUPPLEMENTS (NUTRITIONAL SUPPLEMENT PO)    Take 1 each by mouth 2 (two) times daily. Magic Cup   PROTEIN (PROCEL PO)    Take 1 scoop by mouth 2 (two) times daily.    SENNOSIDES-DOCUSATE SODIUM (SENOKOT-S) 8.6-50 MG TABLET    Take 1 tablet by mouth at bedtime. For constipation   UNABLE TO FIND    Med Name: MedPass 2.0, 120 mL QID PO for nutritional support    Modified Medications   No medications on file  Discontinued Medications   No medications on file     Allergies  Allergen Reactions  . Demerol [Meperidine] Other (See Comments)    agitation  . Lodine [Etodolac] Other (See Comments)    Mouth blisters  . Tylenol [Acetaminophen] Other (See Comments)    dizziness     REVIEW OF SYSTEMS:  Unable to obtain due to advanced dementia    PHYSICAL EXAMINATION  GENERAL APPEARANCE:  In no acute distress. SKIN:  Skin is warm and dry.  HEAD: Normal in size and contour. No evidence of trauma EYES: Lids open and close normally. No blepharitis, entropion or ectropion. PERRL. Conjunctivae are clear and sclerae are white. Lenses are without opacity EARS: Pinnae are normal. Hard of hearing MOUTH and THROAT: Lips are without lesions. Oral mucosa is moist and without lesions. Tongue is normal in shape, size, and color and without lesions NECK: supple, trachea midline, no neck masses, no thyroid tenderness, no thyromegaly LYMPHATICS: no LAN in the neck, no supraclavicular LAN RESPIRATORY: breathing is even & unlabored, BS CTAB CARDIAC: RRR, no murmur,no extra heart sounds, no edema GI: abdomen soft, normal BS, no masses, no tenderness, no hepatomegaly, no splenomegaly EXTREMITIES:  Able to move X 4 extremities, generalized weakness, right hand with palm grip PSYCHIATRIC:  Affect and behavior are appropriate   LABS/RADIOLOGY: Labs reviewed: 09/06/16   Na 144  K 4.2 cO2 29.7  Glucose 101  BUN 24 creatinine 0.66 calcium 8.5 total protein 5.5 albumin 2.87 total bilirubin 0.28 alkaline phosphatase 60 SGOT 11 SGPT 12 GFR >60  WBC 8.4 hemoglobin 13.0 hematocrit 39.5 MCV 97.1 platelet 234 Basic Metabolic Panel:  Recent Labs  16/10/96 05/25/16 06/07/16  NA 141 146 145  K 4.1 3.7 4.2  BUN 20 25* 19  CREATININE 0.9 0.6 0.7   Liver Function Tests:  Recent Labs  03/08/16 06/07/16  AST 11* 14  ALT 12 11  ALKPHOS 109 62   CBC:  Recent Labs   12/11/15 06/07/16  WBC 8.7 8.6  NEUTROABS 6,960 6  HGB 13.3 14.0  HCT 40 42  PLT 276 248   Lipid Panel:  Recent Labs  03/08/16  HDL 35    ASSESSMENT/PLAN:  Vascular dementia without behavioral disturbance - continue supportive care; fall precautions  Contracture of finger joint - continue palm grip in place to right hand, keep skin clean and dry  Essential hypertension - well-controlled without medications  Chronic constipation - continue senna S 8.6-50 mg 1 tab by mouth daily at bedtime  Protein-calorie malnutrition, severe -  Body mass index is 15.18 kg/m. continue fortified foods 3X/day, procel 1 scoop BID, medpass 120 ml QID  Hard of hearing - encourage staff to anticipate needs of resident; face resident  and use gestures during communication process      Goals of care:  Long-term care   Monina C. Medina-Vargas - NP   [facility]  [phone]

## 2016-10-29 ENCOUNTER — Encounter: Payer: Self-pay | Admitting: Adult Health

## 2016-10-29 NOTE — Progress Notes (Signed)
This encounter was created in error - please disregard.

## 2017-05-31 DEATH — deceased

## 2018-01-11 ENCOUNTER — Encounter: Payer: Self-pay | Admitting: Internal Medicine
